# Patient Record
Sex: Female | Born: 1995 | Race: White | Hispanic: No | Marital: Single | Smoking: Former smoker
Health system: Southern US, Community
[De-identification: ages and names within clinical notes are randomized; demographics above are authoritative.]

## PROBLEM LIST (undated history)

## (undated) ENCOUNTER — Inpatient Hospital Stay (HOSPITAL_COMMUNITY): Payer: Self-pay

## (undated) DIAGNOSIS — R9389 Abnormal findings on diagnostic imaging of other specified body structures: Secondary | ICD-10-CM

## (undated) DIAGNOSIS — J302 Other seasonal allergic rhinitis: Secondary | ICD-10-CM

## (undated) DIAGNOSIS — Z789 Other specified health status: Secondary | ICD-10-CM

## (undated) HISTORY — DX: Other seasonal allergic rhinitis: J30.2

## (undated) HISTORY — DX: Abnormal findings on diagnostic imaging of other specified body structures: R93.89

---
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
## 2013-05-07 ENCOUNTER — Encounter (HOSPITAL_COMMUNITY): Payer: Self-pay | Admitting: Emergency Medicine

## 2013-05-07 ENCOUNTER — Emergency Department (HOSPITAL_COMMUNITY): Payer: Managed Care, Other (non HMO)

## 2013-05-07 ENCOUNTER — Emergency Department (HOSPITAL_COMMUNITY)
Admission: EM | Admit: 2013-05-07 | Discharge: 2013-05-07 | Disposition: A | Discharge status: Home / Self Care | Payer: Managed Care, Other (non HMO) | Attending: Emergency Medicine | Admitting: Emergency Medicine

## 2013-05-07 DIAGNOSIS — Y9301 Activity, walking, marching and hiking: Secondary | ICD-10-CM | POA: Insufficient documentation

## 2013-05-07 DIAGNOSIS — S61409A Unspecified open wound of unspecified hand, initial encounter: Secondary | ICD-10-CM | POA: Insufficient documentation

## 2013-05-07 DIAGNOSIS — S80812A Abrasion, left lower leg, initial encounter: Secondary | ICD-10-CM

## 2013-05-07 DIAGNOSIS — W19XXXA Unspecified fall, initial encounter: Secondary | ICD-10-CM

## 2013-05-07 DIAGNOSIS — F17200 Nicotine dependence, unspecified, uncomplicated: Secondary | ICD-10-CM | POA: Insufficient documentation

## 2013-05-07 DIAGNOSIS — S61411A Laceration without foreign body of right hand, initial encounter: Secondary | ICD-10-CM

## 2013-05-07 DIAGNOSIS — W010XXA Fall on same level from slipping, tripping and stumbling without subsequent striking against object, initial encounter: Secondary | ICD-10-CM | POA: Insufficient documentation

## 2013-05-07 DIAGNOSIS — IMO0002 Reserved for concepts with insufficient information to code with codable children: Secondary | ICD-10-CM | POA: Insufficient documentation

## 2013-05-07 DIAGNOSIS — Y929 Unspecified place or not applicable: Secondary | ICD-10-CM | POA: Insufficient documentation

## 2013-05-07 MED ORDER — HYDROCODONE-ACETAMINOPHEN 5-325 MG PO TABS: 1.0000 | ORAL_TABLET | ORAL | Status: DC | PRN

## 2013-05-07 NOTE — ED Notes (Signed)
PA at the bedside suturing patients hand.

## 2013-05-07 NOTE — ED Notes (Signed)
Approx 2 inch laceration to R palm.  Pt states she slipped on ice and fell just pta. Bleeding controlled pta.

## 2013-05-07 NOTE — ED Notes (Signed)
Md at the bedside suturing patient's laceration of the right hand.

## 2013-05-07 NOTE — ED Provider Notes (Signed)
Medical screening examination/treatment/procedure(s) were performed by non-physician practitioner and as supervising physician I was immediately available for consultation/collaboration.   EKG Interpretation None       Stacee Earp M Gifford Ballon, MD 05/07/13 0815 

## 2013-05-07 NOTE — ED Provider Notes (Signed)
CSN: [id number]     Arrival date & time 05/07/13  7829 History   First MD Initiated Contact with Patient 05/07/13 [phone]     Chief Complaint  Patient presents with  . Extremity Laceration     (Consider location/radiation/quality/duration/timing/severity/associated sxs/prior Treatment) HPI  18 year old female presents for evaluation of right hand laceration. Patient report last night she was walking towards her car, slipped on ice, fell forward hitting her hand against hard surface, in which she suffered a hand laceration. She denies hitting her head or loss of consciousness. She did hit her left shin against the ground as well was able to ambulate afterward without any difficulty. Currently she is complaining of sharp pain to the palm of the right hand. She denies any significant pain to the wrist or elbow. No specific treatment tried. She is up-to-date with her tetanus. She has no other complaints.  Past Medical History  Diagnosis Date  . Seasonal allergies    Past Surgical History  Procedure Laterality Date  . Induced abortion  2013   Family History  Problem Relation Age of Onset  . Diabetes Other     Great gradparents paternal  . Diabetes Other     Great gradparents maternal  . Hypertension Maternal Grandmother   . Hypertension Maternal Grandfather   . Allergies Brother   . Allergies Maternal Grandfather   . Heart disease Other     great maternal grandparent  . Lung cancer Other     great maternal grandparent  . Pancreatic cancer Other     great maternal grandparent  . Melanoma Other     great maternal grandparent  . Asthma Mother   . Emphysema Other     great grandfather  . Heart attack Maternal Grandfather    History  Substance Use Topics  . Smoking status: Current Some Day Smoker -- 1.00 packs/day for 3 years    Types: Cigarettes  . Smokeless tobacco: Not on file     Comment: Pt states she smoked less than 1 PPD  . Alcohol Use: No   OB History   Grav Para  Term Preterm Abortions TAB SAB Ect Mult Living   1              Review of Systems  Constitutional: Negative for fever.  Skin: Positive for wound.  Neurological: Negative for numbness.      Allergies  Cefprozil  Home Medications  No current outpatient prescriptions on file. BP 115/62  Pulse 60  Temp(Src) 98 F (36.7 C) (Oral)  Resp 18  SpO2 97%  LMP 04/02/2013 Physical Exam  Nursing note and vitals reviewed. Constitutional: She appears well-developed and well-nourished. No distress.  HENT:  Head: Atraumatic.  Eyes: Conjunctivae are normal.  Neck: Neck supple.  Neurological: She is alert.  Skin: No rash noted.  R hand: 5cm deep laceration to palm of hand overlying hypothenar eminence, no fb noted, bleeding controlled.  ttp   L tib/fib: small anterior skin abrasion with minimal tenderness, no deep laceration, not actively bleeding. No fb.   Psychiatric: She has a normal mood and affect.    ED Course  Procedures (including critical care time)  7:36 AM Pt has a mechanical fall, suffered laceration to her hypothenar eminence involving muscle.  Sutured repair performed by me.  Due to muscle involvement, recommend hand f/u as needed. Pt made aware to remove sutures in 10 days.  Work note provided.     LACERATION REPAIR Performed by: Fayrene Helper Authorized by:  Anola Mcgough Consent: Verbal consent obtained. Risks and benefits: risks, benefits and alternatives were discussed Consent given by: patient Patient identity confirmed: provided demographic data Prepped and Draped in normal sterile fashion Wound explored  Laceration Location: R hand: hypothenar eminence  Laceration Length: 5 cm  No Foreign Bodies seen or palpated  Anesthesia: local infiltration  Local anesthetic: lidocaine 2% w epinephrine  Anesthetic total: 6 ml  Irrigation method: syringe Amount of cleaning: standard  Skin closure: prolene 5.0  Number of sutures: 12  Technique: simple  interrupted, approximation, sharp debridement.  Patient tolerance: Patient tolerated the procedure well with no immediate complications.   Labs Review Labs Reviewed - No data to display Imaging Review Dg Hand Complete Right  05/07/2013   CLINICAL DATA:  Fall, laceration to palm of hand.  EXAM: RIGHT HAND - COMPLETE 3+ VIEW  COMPARISON:  None.  FINDINGS: There is no evidence of fracture or dislocation. There is no evidence of arthropathy or other focal bone abnormality. Soft tissue swelling with overlying bandage material seen at the volar aspect of the right hand, compatible with history of laceration. No retained foreign body.  IMPRESSION: No acute fracture or dislocation.  Soft tissue swelling with irregularity at the volar aspect of the right hand, compatible with history of laceration.   Electronically Signed   By: Rise MuBenjamin  McClintock M.D.   On: 05/07/2013 04:35     EKG Interpretation None      MDM   Final diagnoses:  Fall from standing  Laceration of right hand  Abrasion of anterior left lower leg    BP 119/83  Pulse 84  Temp(Src) 97.8 F (36.6 C) (Oral)  Resp 16  SpO2 96%  LMP 04/02/2013  I have reviewed nursing notes and vital signs. I personally reviewed the imaging tests through PACS system  I reviewed available ER/hospitalization records thought the EMR     Fayrene HelperBowie Chenika Nevils, [place] JerseyPA-C 05/07/13 [id number]

## 2013-05-07 NOTE — Discharge Instructions (Signed)
Apply neosporin to wound edge daily.  Keep your hand spread to allow skin to align correctly.  Follow up with your doctor in 10 days for sutures removal.  Follow up with hand specialist if you have any complications.  Return if you notice any signs of infection.    Laceration Care, Adult A laceration is a cut or lesion that goes through all layers of the skin and into the tissue just beneath the skin. TREATMENT  Some lacerations may not require closure. Some lacerations may not be able to be closed due to an increased risk of infection. It is important to see your caregiver as soon as possible after an injury to minimize the risk of infection and maximize the opportunity for successful closure. If closure is appropriate, pain medicines may be given, if needed. The wound will be cleaned to help prevent infection. Your caregiver will use stitches (sutures), staples, wound glue (adhesive), or skin adhesive strips to repair the laceration. These tools bring the skin edges together to allow for faster healing and a better cosmetic outcome. However, all wounds will heal with a scar. Once the wound has healed, scarring can be minimized by covering the wound with sunscreen during the day for 1 full year. HOME CARE INSTRUCTIONS  For sutures or staples:  Keep the wound clean and dry.  If you were given a bandage (dressing), you should change it at least once a day. Also, change the dressing if it becomes wet or dirty, or as directed by your caregiver.  Wash the wound with soap and water 2 times a day. Rinse the wound off with water to remove all soap. Pat the wound dry with a clean towel.  After cleaning, apply a thin layer of the antibiotic ointment as recommended by your caregiver. This will help prevent infection and keep the dressing from sticking.  You may shower as usual after the first 24 hours. Do not soak the wound in water until the sutures are removed.  Only take over-the-counter or prescription  medicines for pain, discomfort, or fever as directed by your caregiver.  Get your sutures or staples removed as directed by your caregiver. For skin adhesive strips:  Keep the wound clean and dry.  Do not get the skin adhesive strips wet. You may bathe carefully, using caution to keep the wound dry.  If the wound gets wet, pat it dry with a clean towel.  Skin adhesive strips will fall off on their own. You may trim the strips as the wound heals. Do not remove skin adhesive strips that are still stuck to the wound. They will fall off in time. For wound adhesive:  You may briefly wet your wound in the shower or bath. Do not soak or scrub the wound. Do not swim. Avoid periods of heavy perspiration until the skin adhesive has fallen off on its own. After showering or bathing, gently pat the wound dry with a clean towel.  Do not apply liquid medicine, cream medicine, or ointment medicine to your wound while the skin adhesive is in place. This may loosen the film before your wound is healed.  If a dressing is placed over the wound, be careful not to apply tape directly over the skin adhesive. This may cause the adhesive to be pulled off before the wound is healed.  Avoid prolonged exposure to sunlight or tanning lamps while the skin adhesive is in place. Exposure to ultraviolet light in the first year will darken the scar.  The skin adhesive will usually remain in place for 5 to 10 days, then naturally fall off the skin. Do not pick at the adhesive film. You may need a tetanus shot if:  You cannot remember when you had your last tetanus shot.  You have never had a tetanus shot. If you get a tetanus shot, your arm may swell, get red, and feel warm to the touch. This is common and not a problem. If you need a tetanus shot and you choose not to have one, there is a rare chance of getting tetanus. Sickness from tetanus can be serious. SEEK MEDICAL CARE IF:   You have redness, swelling, or  increasing pain in the wound.  You see a red line that goes away from the wound.  You have yellowish-white fluid (pus) coming from the wound.  You have a fever.  You notice a bad smell coming from the wound or dressing.  Your wound breaks open before or after sutures have been removed.  You notice something coming out of the wound such as wood or glass.  Your wound is on your hand or foot and you cannot move a finger or toe. SEEK IMMEDIATE MEDICAL CARE IF:   Your pain is not controlled with prescribed medicine.  You have severe swelling around the wound causing pain and numbness or a change in color in your arm, hand, leg, or foot.  Your wound splits open and starts bleeding.  You have worsening numbness, weakness, or loss of function of any joint around or beyond the wound.  You develop painful lumps near the wound or on the skin anywhere on your body. MAKE SURE YOU:   Understand these instructions.  Will watch your condition.  Will get help right away if you are not doing well or get worse. Document Released: 02/24/2005 Document Revised: 05/19/2011 Document Reviewed: 08/20/2010 [facility] Patient Information 2014 [place], [place].

## 2014-01-09 ENCOUNTER — Encounter (HOSPITAL_COMMUNITY): Payer: Self-pay | Admitting: Emergency Medicine

## 2014-06-09 ENCOUNTER — Emergency Department (INDEPENDENT_AMBULATORY_CARE_PROVIDER_SITE_OTHER)
Admission: EM | Admit: 2014-06-09 | Discharge: 2014-06-09 | Disposition: A | Discharge status: Home / Self Care | Payer: Managed Care, Other (non HMO) | Source: Home / Self Care | Attending: Family Medicine | Admitting: Family Medicine

## 2014-06-09 ENCOUNTER — Encounter (HOSPITAL_COMMUNITY): Payer: Self-pay

## 2014-06-09 DIAGNOSIS — M545 Low back pain, unspecified: Secondary | ICD-10-CM

## 2014-06-09 MED ORDER — CYCLOBENZAPRINE HCL 10 MG PO TABS: 10.0000 mg | ORAL_TABLET | Freq: Every evening | ORAL | Status: DC | PRN

## 2014-06-09 MED ORDER — DICLOFENAC SODIUM 75 MG PO TBEC: 75.0000 mg | DELAYED_RELEASE_TABLET | Freq: Two times a day (BID) | ORAL | Status: DC | PRN

## 2014-06-09 NOTE — ED Provider Notes (Signed)
Kristine Hurst is a 19 y.o. female who presents to Urgent Care today for low back pain. Patient notes mild low back pain present for about a week worsening recently. The pain worsened this morning. Pain is felt in the bilateral low back. The pain does not radiate. No weakness numbness bowel bladder dysfunction or difficulty walking. She has tried some over-the-counter medications which helps some. No Fevers or chills nausea vomiting or diarrhea.   Past Medical History  Diagnosis Date  . Seasonal allergies    Past Surgical History  Procedure Laterality Date  . Induced abortion  2013   History  Substance Use Topics  . Smoking status: Current Some Day Smoker -- 1.00 packs/day for 3 years    Types: Cigarettes  . Smokeless tobacco: Not on file     Comment: Pt states she smoked less than 1 PPD  . Alcohol Use: No   ROS as above Medications: No current facility-administered medications for this encounter.   Current Outpatient Prescriptions  Medication Sig Dispense Refill  . cyclobenzaprine (FLEXERIL) 10 MG tablet Take 1 tablet (10 mg total) by mouth at bedtime as needed for muscle spasms. 20 tablet 0  . diclofenac (VOLTAREN) 75 MG EC tablet Take 1 tablet (75 mg total) by mouth 2 (two) times daily as needed. 30 tablet 0   Allergies  Allergen Reactions  . Cefprozil     *CEFZIL*     Exam:  BP 94/62 mmHg  Pulse 62  Temp(Src) 98.5 F (36.9 C) (Oral)  Resp 18  SpO2 99% Gen: Well NAD HEENT: EOMI,  MMM Lungs: Normal work of breathing. CTABL Heart: RRR no MRG Abd: NABS, Soft. Nondistended, Nontender Exts: Brisk capillary refill, warm and well perfused.  Back: Nontender to spinal midline. Tender palpation bilateral lumbar paraspinals. Low back range of motion is intact with pain with flexion. Negative straight leg raise test and Faber test bilaterally. Reflexes are intact and equal bilateral lower extremities knees and ankles. Strength is intact and equal bilateral lower  extremity. Normal gait. Sensation intact throughout.  No results found for this or any previous visit (from the past 24 hour(s)). No results found.  Assessment and Plan: 19 y.o. female with lumbago due to myofascial strain. Treat with home exercise program, heating pad, Flexeril, and diclofenac.  Discussed warning signs or symptoms. Please see discharge instructions. Patient expresses understanding.     Rodolph BongEvan S Felice Hope, MD 06/09/14 2119

## 2014-06-09 NOTE — ED Notes (Signed)
States she has been having pain in her back x 1 week, much worse today

## 2014-06-09 NOTE — Discharge Instructions (Signed)
Thank you for coming in today. °Come back or go to the emergency room if you notice new weakness new numbness problems walking or bowel or bladder problems. ° ° °Back Exercises °These exercises may help you when beginning to rehabilitate your injury. Your symptoms may resolve with or without further involvement from your physician, physical therapist or athletic trainer. While completing these exercises, remember:  °· Restoring tissue flexibility helps normal motion to return to the joints. This allows healthier, less painful movement and activity. °· An effective stretch should be held for at least 30 seconds. °· A stretch should never be painful. You should only feel a gentle lengthening or release in the stretched tissue. °STRETCH - Extension, Prone on Elbows  °· Lie on your stomach on the floor, a bed will be too soft. Place your palms about shoulder width apart and at the height of your head. °· Place your elbows under your shoulders. If this is too painful, stack pillows under your chest. °· Allow your body to relax so that your hips drop lower and make contact more completely with the floor. °· Hold this position for __________ seconds. °· Slowly return to lying flat on the floor. °Repeat __________ times. Complete this exercise __________ times per day.  °RANGE OF MOTION - Extension, Prone Press Ups  °· Lie on your stomach on the floor, a bed will be too soft. Place your palms about shoulder width apart and at the height of your head. °· Keeping your back as relaxed as possible, slowly straighten your elbows while keeping your hips on the floor. You may adjust the placement of your hands to maximize your comfort. As you gain motion, your hands will come more underneath your shoulders. °· Hold this position __________ seconds. °· Slowly return to lying flat on the floor. °Repeat __________ times. Complete this exercise __________ times per day.  °RANGE OF MOTION- Quadruped, Neutral Spine  °· Assume a hands  and knees position on a firm surface. Keep your hands under your shoulders and your knees under your hips. You may place padding under your knees for comfort. °· Drop your head and point your tail bone toward the ground below you. This will round out your low back like an angry cat. Hold this position for __________ seconds. °· Slowly lift your head and release your tail bone so that your back sags into a large arch, like an old horse. °· Hold this position for __________ seconds. °· Repeat this until you feel limber in your low back. °· Now, find your "sweet spot." This will be the most comfortable position somewhere between the two previous positions. This is your neutral spine. Once you have found this position, tense your stomach muscles to support your low back. °· Hold this position for __________ seconds. °Repeat __________ times. Complete this exercise __________ times per day.  °STRETCH - Flexion, Single Knee to Chest  °· Lie on a firm bed or floor with both legs extended in front of you. °· Keeping one leg in contact with the floor, bring your opposite knee to your chest. Hold your leg in place by either grabbing behind your thigh or at your knee. °· Pull until you feel a gentle stretch in your low back. Hold __________ seconds. °· Slowly release your grasp and repeat the exercise with the opposite side. °Repeat __________ times. Complete this exercise __________ times per day.  °STRETCH - Hamstrings, Standing °· Stand or sit and extend your right / left   leg, placing your foot on a chair or foot stool °· Keeping a slight arch in your low back and your hips straight forward. °· Lead with your chest and lean forward at the waist until you feel a gentle stretch in the back of your right / left knee or thigh. (When done correctly, this exercise requires leaning only a small distance.) °· Hold this position for __________ seconds. °Repeat __________ times. Complete this stretch __________ times per  day. °STRENGTHENING - Deep Abdominals, Pelvic Tilt  °· Lie on a firm bed or floor. Keeping your legs in front of you, bend your knees so they are both pointed toward the ceiling and your feet are flat on the floor. °· Tense your lower abdominal muscles to press your low back into the floor. This motion will rotate your pelvis so that your tail bone is scooping upwards rather than pointing at your feet or into the floor. °· With a gentle tension and even breathing, hold this position for __________ seconds. °Repeat __________ times. Complete this exercise __________ times per day.  °STRENGTHENING - Abdominals, Crunches  °· Lie on a firm bed or floor. Keeping your legs in front of you, bend your knees so they are both pointed toward the ceiling and your feet are flat on the floor. Cross your arms over your chest. °· Slightly tip your chin down without bending your neck. °· Tense your abdominals and slowly lift your trunk high enough to just clear your shoulder blades. Lifting higher can put excessive stress on the low back and does not further strengthen your abdominal muscles. °· Control your return to the starting position. °Repeat __________ times. Complete this exercise __________ times per day.  °STRENGTHENING - Quadruped, Opposite UE/LE Lift  °· Assume a hands and knees position on a firm surface. Keep your hands under your shoulders and your knees under your hips. You may place padding under your knees for comfort. °· Find your neutral spine and gently tense your abdominal muscles so that you can maintain this position. Your shoulders and hips should form a rectangle that is parallel with the floor and is not twisted. °· Keeping your trunk steady, lift your right hand no higher than your shoulder and then your left leg no higher than your hip. Make sure you are not holding your breath. Hold this position __________ seconds. °· Continuing to keep your abdominal muscles tense and your back steady, slowly return  to your starting position. Repeat with the opposite arm and leg. °Repeat __________ times. Complete this exercise __________ times per day. °Document Released: 03/14/2005 Document Revised: 05/19/2011 Document Reviewed: 06/08/2008 °[employer]® Patient Information ©2015 [employer]. This information is not intended to replace advice given to you by your health care provider. Make sure you discuss any questions you have with your health care provider. ° °

## 2014-08-10 ENCOUNTER — Emergency Department (HOSPITAL_COMMUNITY)
Admission: EM | Admit: 2014-08-10 | Discharge: 2014-08-10 | Disposition: A | Discharge status: Home / Self Care | Payer: Managed Care, Other (non HMO) | Source: Home / Self Care | Attending: Family Medicine | Admitting: Family Medicine

## 2014-08-10 ENCOUNTER — Encounter (HOSPITAL_COMMUNITY): Payer: Self-pay | Admitting: Family Medicine

## 2014-08-10 DIAGNOSIS — R1084 Generalized abdominal pain: Secondary | ICD-10-CM | POA: Diagnosis not present

## 2014-08-10 DIAGNOSIS — R110 Nausea: Secondary | ICD-10-CM | POA: Diagnosis not present

## 2014-08-10 MED ORDER — OMEPRAZOLE 40 MG PO CPDR: 40.0000 mg | DELAYED_RELEASE_CAPSULE | Freq: Every day | ORAL | Status: DC

## 2014-08-10 MED ORDER — ONDANSETRON HCL 4 MG PO TABS: 4.0000 mg | ORAL_TABLET | Freq: Three times a day (TID) | ORAL | Status: DC | PRN

## 2014-08-10 NOTE — ED Provider Notes (Signed)
CSN: [id number]     Arrival date & time 08/10/14  1744 History   First MD Initiated Contact with Patient 08/10/14 1810     Chief Complaint  Patient presents with  . Nausea   (Consider location/radiation/quality/duration/timing/severity/associated sxs/prior Treatment) HPI  Nausea and abd cramping. Started 4 wks ago. Decreased appetite. Getting worse. Ibuprofen and tyelnol w/ come benefit. Comes and goes. Typically comes on when eating after work in the evenings. Able to eat at work w/o symptoms. Occassional emesis which burns esophagus. LMP on 07/25/14. Associated w/ intermittent lightheadedness when "I stand up to quickly."   Denies diarrhea, constipation, fevers, rash, headache, chest pain, shortness of breath, emesis, hematochezia.   Past Medical History  Diagnosis Date  . Seasonal allergies    Past Surgical History  Procedure Laterality Date  . Induced abortion  2013   Family History  Problem Relation Age of Onset  . Diabetes Other     Great gradparents paternal  . Diabetes Other     Great gradparents maternal  . Hypertension Maternal Grandmother   . Hypertension Maternal Grandfather   . Allergies Brother   . Allergies Maternal Grandfather   . Heart disease Other     great maternal grandparent  . Lung cancer Other     great maternal grandparent  . Pancreatic cancer Other     great maternal grandparent  . Melanoma Other     great maternal grandparent  . Asthma Mother   . Emphysema Other     great grandfather  . Heart attack Maternal Grandfather    History  Substance Use Topics  . Smoking status: Current Some Day Smoker -- 1.00 packs/day for 3 years    Types: Cigarettes  . Smokeless tobacco: Not on file     Comment: Pt states she smoked less than 1 PPD  . Alcohol Use: No   OB History    Gravida Para Term Preterm AB TAB SAB Ectopic Multiple Living   1              Review of Systems Per HPI with all other pertinent systems negative.   Allergies   Cefprozil  Home Medications   Prior to Admission medications   Medication Sig Start Date End Date Taking? Authorizing Provider  omeprazole (PRILOSEC) 40 MG capsule Take 1 capsule (40 mg total) by mouth daily. Take for 14 days then as needed 08/10/14   Ozella Rocks, MD  ondansetron (ZOFRAN) 4 MG tablet Take 1 tablet (4 mg total) by mouth every 8 (eight) hours as needed for nausea or vomiting. 08/10/14   Ozella Rocks, MD   Pulse 97  Temp(Src) 98.6 F (37 C) (Oral)  Resp 16  SpO2 97%  LMP 07/25/2014 Physical Exam Physical Exam  Constitutional: oriented to person, place, and time. appears well-developed and well-nourished. No distress.  HENT:  Head: Normocephalic and atraumatic.  Eyes: EOMI. PERRL.  Neck: Normal range of motion.  Cardiovascular: RRR, no m/r/g, 2+ distal pulses,  Pulmonary/Chest: Effort normal and breath sounds normal. No respiratory distress.  Abdominal: Soft. Bowel sounds are normal. NonTTP, no distension.  Musculoskeletal: Normal range of motion. Non ttp, no effusion.  Neurological: alert and oriented to person, place, and time.  Skin: Skin is warm. No rash noted. non diaphoretic.  Psychiatric: normal mood and affect. behavior is normal. Judgment and thought content normal.   ED Course  Procedures (including critical care time) Labs Review Labs Reviewed - No data to display  Imaging Review No  results found.   MDM   1. Generalized abdominal pain    H. pylori negative. Will treat with Zofran and PPI. Patient describing some reflux type symptoms. Follow up with PCP if needed. Unlikely related to cholecystitis or appendicitis.    Ozella Rocksavid J Terriana Barreras, MD 08/10/14 [phone]

## 2014-08-10 NOTE — ED Notes (Signed)
C/o  Nausea with a decreased appetite for about a month.  Mild abdominal discomforts across center of abdomen.  Denies vomiting and diarrhea.   Mild relief in pain with otc meds.   Last known LMP 07/25/2014

## 2014-08-10 NOTE — Discharge Instructions (Signed)
The cause of your symptoms is likely from a combination of a mild viral infection and reflux. The H. pylori test was negative. Please take the zofran for nausea and use the prilosec to decrease the acid production in your stomach.

## 2014-08-11 LAB — POCT H PYLORI SCREEN: H. PYLORI SCREEN, POC: NEGATIVE

## 2014-11-19 ENCOUNTER — Emergency Department (HOSPITAL_COMMUNITY)
Admission: EM | Admit: 2014-11-19 | Discharge: 2014-11-20 | Disposition: A | Discharge status: Home / Self Care | Payer: Managed Care, Other (non HMO) | Attending: Emergency Medicine | Admitting: Emergency Medicine

## 2014-11-19 ENCOUNTER — Encounter (HOSPITAL_COMMUNITY): Payer: Self-pay | Admitting: Emergency Medicine

## 2014-11-19 DIAGNOSIS — O210 Mild hyperemesis gravidarum: Secondary | ICD-10-CM | POA: Insufficient documentation

## 2014-11-19 DIAGNOSIS — Z3A01 Less than 8 weeks gestation of pregnancy: Secondary | ICD-10-CM | POA: Diagnosis not present

## 2014-11-19 DIAGNOSIS — R1013 Epigastric pain: Secondary | ICD-10-CM | POA: Insufficient documentation

## 2014-11-19 DIAGNOSIS — O99331 Smoking (tobacco) complicating pregnancy, first trimester: Secondary | ICD-10-CM | POA: Diagnosis not present

## 2014-11-19 DIAGNOSIS — O99281 Endocrine, nutritional and metabolic diseases complicating pregnancy, first trimester: Secondary | ICD-10-CM | POA: Insufficient documentation

## 2014-11-19 DIAGNOSIS — R112 Nausea with vomiting, unspecified: Secondary | ICD-10-CM

## 2014-11-19 DIAGNOSIS — O9989 Other specified diseases and conditions complicating pregnancy, childbirth and the puerperium: Secondary | ICD-10-CM | POA: Insufficient documentation

## 2014-11-19 DIAGNOSIS — R05 Cough: Secondary | ICD-10-CM | POA: Diagnosis not present

## 2014-11-19 DIAGNOSIS — F17210 Nicotine dependence, cigarettes, uncomplicated: Secondary | ICD-10-CM | POA: Diagnosis not present

## 2014-11-19 DIAGNOSIS — Z3490 Encounter for supervision of normal pregnancy, unspecified, unspecified trimester: Secondary | ICD-10-CM

## 2014-11-19 MED ORDER — ONDANSETRON 4 MG PO TBDP
4.0000 mg | ORAL_TABLET | Freq: Once | ORAL | Status: AC
  Administered 2014-11-20: 4 mg via ORAL
  Filled 2014-11-19: qty 1

## 2014-11-19 NOTE — ED Provider Notes (Signed)
CSN: [id number]     Arrival date & time 11/19/14  2311 History   First MD Initiated Contact with Patient 11/19/14 2335     Chief Complaint  Patient presents with  . Emesis     (Consider location/radiation/quality/duration/timing/severity/associated sxs/prior Treatment) HPI Comments: This is a morbidly obese [age]-year-old with a six-day history of cough, nausea and vomiting.  She was seen by her PCP and she was given a cough medication and a prescription for Zofran, which she has not filled and she has persistent nausea and vomiting.  She states over the past 2 days.  She's been unable to tolerate any even liquid.  Patient is a 19 y.o. female presenting with vomiting. The history is provided by the patient.  Emesis Severity:  Mild Duration:  6 days Timing:  Intermittent Quality:  Bilious material Progression:  Unchanged Chronicity:  Recurrent Relieved by:  None tried Ineffective treatments:  None tried Associated symptoms: abdominal pain and cough   Associated symptoms: no chills and no diarrhea   Abdominal pain:    Location:  Epigastric   Quality:  Dull   Severity:  Mild   Onset quality:  Unable to specify   Timing:  Unable to specify   Progression:  Unable to specify   Chronicity:  Recurrent   Past Medical History  Diagnosis Date  . Seasonal allergies    Past Surgical History  Procedure Laterality Date  . Induced abortion  2013   Family History  Problem Relation Age of Onset  . Diabetes Other     Great gradparents paternal  . Diabetes Other     Great gradparents maternal  . Hypertension Maternal Grandmother   . Hypertension Maternal Grandfather   . Allergies Brother   . Allergies Maternal Grandfather   . Heart disease Other     great maternal grandparent  . Lung cancer Other     great maternal grandparent  . Pancreatic cancer Other     great maternal grandparent  . Melanoma Other     great maternal grandparent  . Asthma Mother   . Emphysema Other    great grandfather  . Heart attack Maternal Grandfather    Social History  Substance Use Topics  . Smoking status: Current Some Day Smoker -- 0.00 packs/day for 3 years    Types: Cigarettes  . Smokeless tobacco: None     Comment: Pt states she smoked less than 1 PPD  . Alcohol Use: No   OB History    Gravida Para Term Preterm AB TAB SAB Ectopic Multiple Living   1              Review of Systems  Constitutional: Negative for fever and chills.  Respiratory: Positive for cough. Negative for shortness of breath.   Gastrointestinal: Positive for nausea, vomiting and abdominal pain. Negative for diarrhea, constipation and abdominal distention.  Genitourinary: Negative for dysuria.  Skin: Negative for rash and wound.  All other systems reviewed and are negative.     Allergies  Cefprozil  Home Medications   Prior to Admission medications   Medication Sig Start Date End Date Taking? Authorizing Provider  omeprazole (PRILOSEC) 40 MG capsule Take 1 capsule (40 mg total) by mouth daily. Take for 14 days then as needed 08/10/14   Ozella Rocks, MD  ondansetron (ZOFRAN) 4 MG tablet Take 1 tablet (4 mg total) by mouth every 8 (eight) hours as needed for nausea or vomiting. 08/10/14   Ozella Rocks, MD  BP 105/77 mmHg  Pulse 72  Temp(Src) 97.7 F (36.5 C) (Oral)  Resp 18  Ht 5\' 7"  (1.702 m)  Wt 270 lb (122.471 kg)  BMI 42.28 kg/m2  SpO2 100%  LMP 10/15/2014 Physical Exam  Constitutional: She is oriented to person, place, and time. She appears well-developed and well-nourished.  HENT:  Head: Normocephalic.  Eyes: Pupils are equal, round, and reactive to light.  Neck: Normal range of motion.  Cardiovascular: Normal rate and regular rhythm.   Pulmonary/Chest: Effort normal and breath sounds normal.  Abdominal: Soft. Bowel sounds are normal. She exhibits no distension. There is tenderness in the epigastric area.  Musculoskeletal: Normal range of motion.  Neurological: She is  alert and oriented to person, place, and time.  Skin: Skin is warm and dry. No rash noted.  Psychiatric: She has a normal mood and affect.  Nursing note and vitals reviewed.   ED Course  Procedures (including critical care time) Labs Review Labs Reviewed  I-STAT CHEM 8, ED - Abnormal; Notable for the following:    Potassium 3.4 (*)    Chloride 98 (*)    All other components within normal limits  I-STAT BETA HCG BLOOD, ED ([facility], [facility], [facility] ONLY) - Abnormal; Notable for the following:    I-stat hCG, quantitative >2000.0 (*)    All other components within normal limits  CBC WITH DIFFERENTIAL/PLATELET    Imaging Review No results found. I have personally reviewed and evaluated these images and lab results as part of my medical decision-making.   EKG Interpretation None     Patient's pregnancy test is positive.  She has been given instructions to use over-the-counter Unisom to help control nausea and vomiting MDM   Final diagnoses:  Non-intractable vomiting with nausea, vomiting of unspecified type  Pregnancy         Earley Favor, NP 11/20/14 0129  Earley Favor, NP 11/20/14 1610  Alvira Monday, MD 11/23/14 1221

## 2014-11-19 NOTE — ED Notes (Signed)
Gail, NP at the bedside.  

## 2014-11-19 NOTE — ED Notes (Addendum)
Pt. reports persistent nausea and vomitting onset last week diagnosed by her PCP with " stomach virus" , denies diarrhea / no fever or chills.

## 2014-11-20 LAB — CBC WITH DIFFERENTIAL/PLATELET
BASOS ABS: 0 10*3/uL (ref 0.0–0.1)
Basophils Relative: 0 % (ref 0–1)
EOS ABS: 0 10*3/uL (ref 0.0–0.7)
Eosinophils Relative: 0 % (ref 0–5)
HCT: 40.5 % (ref 36.0–46.0)
HEMOGLOBIN: 14 g/dL (ref 12.0–15.0)
LYMPHS ABS: 2.4 10*3/uL (ref 0.7–4.0)
Lymphocytes Relative: 23 % (ref 12–46)
MCH: 31.4 pg (ref 26.0–34.0)
MCHC: 34.6 g/dL (ref 30.0–36.0)
MCV: 90.8 fL (ref 78.0–100.0)
Monocytes Absolute: 0.9 10*3/uL (ref 0.1–1.0)
Monocytes Relative: 8 % (ref 3–12)
NEUTROS PCT: 69 % (ref 43–77)
Neutro Abs: 7 10*3/uL (ref 1.7–7.7)
Platelets: 292 10*3/uL (ref 150–400)
RBC: 4.46 MIL/uL (ref 3.87–5.11)
RDW: 12.4 % (ref 11.5–15.5)
WBC: 10.4 10*3/uL (ref 4.0–10.5)

## 2014-11-20 LAB — I-STAT CHEM 8, ED
BUN: 7 mg/dL (ref 6–20)
CHLORIDE: 98 mmol/L — AB (ref 101–111)
Calcium, Ion: 1.2 mmol/L (ref 1.12–1.23)
Creatinine, Ser: 0.8 mg/dL (ref 0.44–1.00)
Glucose, Bld: 84 mg/dL (ref 65–99)
HCT: 42 % (ref 36.0–46.0)
Hemoglobin: 14.3 g/dL (ref 12.0–15.0)
POTASSIUM: 3.4 mmol/L — AB (ref 3.5–5.1)
Sodium: 135 mmol/L (ref 135–145)
TCO2: 23 mmol/L (ref 0–100)

## 2014-11-20 LAB — I-STAT BETA HCG BLOOD, ED (MC, WL, AP ONLY): I-stat hCG, quantitative: >2000 m[IU]/mL — ABNORMAL HIGH (ref <5)

## 2014-11-20 NOTE — Discharge Instructions (Signed)
Nausea and Vomiting Nausea means you feel sick to your stomach. Throwing up (vomiting) is a reflex where stomach contents come out of your mouth. HOME CARE   Take medicine as told by your doctor.  Do not force yourself to eat. However, you do need to drink fluids.  If you feel like eating, eat a normal diet as told by your doctor.  Eat rice, wheat, potatoes, bread, lean meats, yogurt, fruits, and vegetables.  Avoid high-fat foods.  Drink enough fluids to keep your pee (urine) clear or pale yellow.  Ask your doctor how to replace body fluid losses (rehydrate). Signs of body fluid loss (dehydration) include:  Feeling very thirsty.  Dry lips and mouth.  Feeling dizzy.  Dark pee.  Peeing less than normal.  Feeling confused.  Fast breathing or heart rate. GET HELP RIGHT AWAY IF:   You have blood in your throw up.  You have black or bloody poop (stool).  You have a bad headache or stiff neck.  You feel confused.  You have bad belly (abdominal) pain.  You have chest pain or trouble breathing.  You do not pee at least once every 8 hours.  You have cold, clammy skin.  You keep throwing up after 24 to 48 hours.  You have a fever. MAKE SURE YOU:   Understand these instructions.  Will watch your condition.  Will get help right away if you are not doing well or get worse. Document Released: 08/13/2007 Document Revised: 05/19/2011 Document Reviewed: 07/26/2010 [facility] Patient Information 2015 [place], [place]. This information is not intended to replace advice given to you by your health care provider. Make sure you discuss any questions you have with your health care provider. If you cannot afford to buy the Zofran, you can buy over-the-counter Unisom from [facility] take 1/2-1 tablet every 4-6 hours as needed for nausea and vomiting

## 2014-11-20 NOTE — ED Notes (Signed)
Given sprite to sip on for PO fluid challenge.

## 2014-11-20 NOTE — ED Notes (Signed)
Lab at the bedside 

## 2015-02-06 ENCOUNTER — Emergency Department (HOSPITAL_COMMUNITY)
Admission: EM | Admit: 2015-02-06 | Discharge: 2015-02-06 | Disposition: A | Discharge status: Home / Self Care | Payer: Managed Care, Other (non HMO) | Attending: Emergency Medicine | Admitting: Emergency Medicine

## 2015-02-06 ENCOUNTER — Emergency Department (HOSPITAL_COMMUNITY): Payer: Managed Care, Other (non HMO)

## 2015-02-06 ENCOUNTER — Encounter (HOSPITAL_COMMUNITY): Payer: Self-pay | Admitting: Emergency Medicine

## 2015-02-06 DIAGNOSIS — R000 Tachycardia, unspecified: Secondary | ICD-10-CM | POA: Diagnosis not present

## 2015-02-06 DIAGNOSIS — F17210 Nicotine dependence, cigarettes, uncomplicated: Secondary | ICD-10-CM | POA: Diagnosis not present

## 2015-02-06 DIAGNOSIS — M79674 Pain in right toe(s): Secondary | ICD-10-CM | POA: Diagnosis present

## 2015-02-06 DIAGNOSIS — L03031 Cellulitis of right toe: Secondary | ICD-10-CM | POA: Diagnosis not present

## 2015-02-06 DIAGNOSIS — L03011 Cellulitis of right finger: Secondary | ICD-10-CM

## 2015-02-06 MED ORDER — LIDOCAINE HCL (PF) 1 % IJ SOLN
5.0000 mL | Freq: Once | INTRAMUSCULAR | Status: DC
  Filled 2015-02-06: qty 5

## 2015-02-06 MED ORDER — SULFAMETHOXAZOLE-TRIMETHOPRIM 800-160 MG PO TABS: 1.0000 | ORAL_TABLET | Freq: Two times a day (BID) | ORAL | Status: DC

## 2015-02-06 MED ORDER — BUPIVACAINE HCL (PF) 0.5 % IJ SOLN
10.0000 mL | Freq: Once | INTRAMUSCULAR | Status: AC
  Administered 2015-02-06: 10 mL
  Filled 2015-02-06: qty 10

## 2015-02-06 MED ORDER — BUPIVACAINE HCL (PF) 0.25 % IJ SOLN: 10.0000 mL | Freq: Once | INTRAMUSCULAR | Status: DC

## 2015-02-06 MED ORDER — LIDOCAINE HCL (PF) 1 % IJ SOLN
5.0000 mL | Freq: Once | INTRAMUSCULAR | Status: AC
  Administered 2015-02-06: 5 mL

## 2015-02-06 MED ORDER — SULFAMETHOXAZOLE-TRIMETHOPRIM 800-160 MG PO TABS
1.0000 | ORAL_TABLET | Freq: Once | ORAL | Status: AC
  Administered 2015-02-06: 1 via ORAL
  Filled 2015-02-06: qty 1

## 2015-02-06 NOTE — ED Provider Notes (Signed)
INCISION AND DRAINAGE Performed by: Arman FilterSCHULZ,[place] Souter K Consent: Verbal consent obtained. Risks and benefits: risks, benefits and alternatives were discussed Type: abscess  Body area: toe  Anesthesia: local infiltration  Incision was made with a scalpel.  Local anesthetic: lidocaine .5% without epinephrine  Anesthetic total: 2 ml  Complexity: complex Blunt dissection to break up loculations  Drainage: purulent  Drainage amount: scant  Packing material: 1/4 in iodoform gauze  Patient tolerance: Patient tolerated the procedure well with no immediate complications.    Earley FavorGail Makalynn Berwanger, NP 02/06/15 [phone]  Dione Boozeavid Glick, MD 02/06/15 [phone]

## 2015-02-06 NOTE — ED Provider Notes (Signed)
CSN: [id number]     Arrival date & time 02/06/15  0054 History   First MD Initiated Contact with Patient 02/06/15 0132     Chief Complaint  Patient presents with  . Toe Pain     (Consider location/radiation/quality/duration/timing/severity/associated sxs/prior Treatment) Patient is a 19 y.o. female presenting with toe pain. The history is provided by the patient.  Toe Pain This is a new problem. The current episode started in the past 7 days. The problem occurs constantly. The problem has been gradually worsening.   Kristine Hurst is a 19 y.o. female who presents to the ED with pain and swelling of the right middle toe that started last week. She denies injury.  Past Medical History  Diagnosis Date  . Seasonal allergies    Past Surgical History  Procedure Laterality Date  . Induced abortion  2013   Family History  Problem Relation Age of Onset  . Diabetes Other     Great gradparents paternal  . Diabetes Other     Great gradparents maternal  . Hypertension Maternal Grandmother   . Hypertension Maternal Grandfather   . Allergies Brother   . Allergies Maternal Grandfather   . Heart disease Other     great maternal grandparent  . Lung cancer Other     great maternal grandparent  . Pancreatic cancer Other     great maternal grandparent  . Melanoma Other     great maternal grandparent  . Asthma Mother   . Emphysema Other     great grandfather  . Heart attack Maternal Grandfather    Social History  Substance Use Topics  . Smoking status: Current Some Day Smoker -- 0.00 packs/day for 3 years    Types: Cigarettes  . Smokeless tobacco: None     Comment: Pt states she smoked less than 1 PPD  . Alcohol Use: No   OB History    Gravida Para Term Preterm AB TAB SAB Ectopic Multiple Living   1              Review of Systems  Musculoskeletal:       Right toe pain  all other systems negative    Allergies  Cefprozil  Home Medications   Prior to Admission  medications   Medication Sig Start Date End Date Taking? Authorizing Provider  omeprazole (PRILOSEC) 40 MG capsule Take 1 capsule (40 mg total) by mouth daily. Take for 14 days then as needed 08/10/14   Ozella Rocks, MD  ondansetron (ZOFRAN) 4 MG tablet Take 1 tablet (4 mg total) by mouth every 8 (eight) hours as needed for nausea or vomiting. 08/10/14   Ozella Rocks, MD   BP 111/73 mmHg  Pulse 101  Temp(Src) 98.1 F (36.7 C) (Oral)  Resp 16  Ht  (1.702 m)  Wt 112.946 kg  BMI 38.99 kg/m2  SpO2 98%  LMP 01/24/2015 Physical Exam  Constitutional: She is oriented to person, place, and time. She appears well-developed and well-nourished. No distress.  HENT:  Head: Normocephalic.  Eyes: EOM are normal.  Neck: Neck supple.  Cardiovascular: Tachycardia present.   Pulmonary/Chest: Effort normal.  Musculoskeletal: Normal range of motion.       Feet:  Swelling, erythema and tenderness to the distal aspect of the right third toe. Pedal pulse 2+, adequate circulation.    Neurological: She is alert and oriented to person, place, and time. No cranial nerve deficit.  Skin: Skin is warm and dry.  Psychiatric: She  has a normal mood and affect. Her behavior is normal.  Nursing note and vitals reviewed.   ED Course  Procedures (including critical care time) Labs Review Labs Reviewed - No data to display  Imaging Review Dg Toe 3rd Right  02/06/2015  CLINICAL DATA:  Pain at the right third toe, with erythema, acute onset. Initial encounter. EXAM: RIGHT THIRD TOE COMPARISON:  Right foot radiographs performed 05/18/2010 FINDINGS: No definite osseous erosions are seen to suggest osteomyelitis. Diffuse soft tissue swelling is noted about the third toe. Visualized joint spaces are preserved. No radiopaque foreign bodies are seen. IMPRESSION: Diffuse soft tissue swelling about the third toe. No definite osseous erosion seen to suggest osteomyelitis, though evaluation for osteomyelitis is somewhat  limited on radiograph. Electronically Signed   By: Roanna RaiderJeffery  Chang M.D.   On: 02/06/2015 02:25    MDM  19 y.o. female with right third toe infection and tenderness.  [facility] Shultz, [place] @ 03:25 am.     [facility] Hospital[facility] M DurandNeese, [place] 02/06/15 [id number]  Dione Boozeavid Glick, MD 02/06/15 [phone]

## 2015-02-06 NOTE — Discharge Instructions (Signed)
Cellulitis Cellulitis is an infection of the skin and the tissue beneath it. The infected area is usually red and tender. Cellulitis occurs most often in the arms and lower legs.  CAUSES  Cellulitis is caused by bacteria that enter the skin through cracks or cuts in the skin. The most common types of bacteria that cause cellulitis are staphylococci and streptococci. SIGNS AND SYMPTOMS   Redness and warmth.  Swelling.  Tenderness or pain.  Fever. DIAGNOSIS  Your health care provider can usually determine what is wrong based on a physical exam. Blood tests may also be done. TREATMENT  Treatment usually involves taking an antibiotic medicine. HOME CARE INSTRUCTIONS   Take your antibiotic medicine as directed by your health care provider. Finish the antibiotic even if you start to feel better.  Keep the infected arm or leg elevated to reduce swelling.  Apply a warm cloth to the affected area up to 4 times per day to relieve pain.  Take medicines only as directed by your health care provider.  Keep all follow-up visits as directed by your health care provider. SEEK MEDICAL CARE IF:   You notice red streaks coming from the infected area.  Your red area gets larger or turns dark in color.  Your bone or joint underneath the infected area becomes painful after the skin has healed.  Your infection returns in the same area or another area.  You notice a swollen bump in the infected area.  You develop new symptoms.  You have a fever. SEEK IMMEDIATE MEDICAL CARE IF:   You feel very sleepy.  You develop vomiting or diarrhea.  You have a general ill feeling (malaise) with muscle aches and pains.   This information is not intended to replace advice given to you by your health care provider. Make sure you discuss any questions you have with your health care provider.   Document Released: 12/04/2004 Document Revised: 11/15/2014 Document Reviewed: 05/12/2011 Elsevier Interactive  Patient Education 2016 Elsevier Inc.  Paronychia  Paronychia is an infection of the skin. It happens near a fingernail or toenail. It may cause pain and swelling around the nail. Usually, it is not serious and it clears up with treatment. HOME CARE  Soak the fingers or toes in warm water as told by your doctor. You may be told to do this for 20 minutes, 2-3 times a day.  Keep the area dry when you are not soaking it.  Take medicines only as told by your doctor.  If you were given an antibiotic medicine, finish all of it even if you start to feel better.  Keep the affected area clean.  Do not try to drain a fluid-filled bump yourself.  Wear rubber gloves when putting your hands in water.  Wear gloves if your hands might touch cleaners or chemicals.  Follow your doctor's instructions about:  Wound care.  Bandage (dressing) changes and removal. GET HELP IF:  Your symptoms get worse or do not improve.  You have a fever or chills.  You have redness spreading from the affected area.  You have more fluid, blood, or pus coming from the affected area.  Your finger or knuckle is swollen or is hard to move.   This information is not intended to replace advice given to you by your health care provider. Make sure you discuss any questions you have with your health care provider.   Document Released: 02/12/2009 Document Revised: 07/11/2014 Document Reviewed: 02/01/2014 Elsevier Interactive Patient Education 2016  Elsevier Inc. Your infection was opend and drained pull the packing out in 2 days   Warm soaks 2-3 times a day for the next 2 days take all the antibiotic

## 2015-02-06 NOTE — ED Notes (Signed)
Pt. reports pain/swelling at right distal middle toe onset last week , denies injury / no drainage .

## 2015-02-08 ENCOUNTER — Emergency Department (HOSPITAL_COMMUNITY): Payer: Managed Care, Other (non HMO)

## 2015-02-08 ENCOUNTER — Encounter (HOSPITAL_COMMUNITY): Payer: Self-pay | Admitting: *Deleted

## 2015-02-08 ENCOUNTER — Emergency Department (HOSPITAL_COMMUNITY)
Admission: EM | Admit: 2015-02-08 | Discharge: 2015-02-08 | Disposition: A | Discharge status: Home / Self Care | Payer: Managed Care, Other (non HMO) | Attending: Emergency Medicine | Admitting: Emergency Medicine

## 2015-02-08 DIAGNOSIS — Z79899 Other long term (current) drug therapy: Secondary | ICD-10-CM | POA: Diagnosis not present

## 2015-02-08 DIAGNOSIS — F17210 Nicotine dependence, cigarettes, uncomplicated: Secondary | ICD-10-CM | POA: Insufficient documentation

## 2015-02-08 DIAGNOSIS — Z792 Long term (current) use of antibiotics: Secondary | ICD-10-CM | POA: Diagnosis not present

## 2015-02-08 DIAGNOSIS — L03031 Cellulitis of right toe: Secondary | ICD-10-CM | POA: Diagnosis not present

## 2015-02-08 DIAGNOSIS — L0390 Cellulitis, unspecified: Secondary | ICD-10-CM

## 2015-02-08 DIAGNOSIS — M79674 Pain in right toe(s): Secondary | ICD-10-CM | POA: Diagnosis present

## 2015-02-08 MED ORDER — TRAMADOL HCL 50 MG PO TABS
50.0000 mg | ORAL_TABLET | Freq: Four times a day (QID) | ORAL | Status: DC | PRN
Start: 2015-02-08 — End: 2015-09-09

## 2015-02-08 MED ORDER — BUPIVACAINE HCL (PF) 0.5 % IJ SOLN
10.0000 mL | Freq: Once | INTRAMUSCULAR | Status: AC
  Administered 2015-02-08: 10 mL
  Filled 2015-02-08: qty 10

## 2015-02-08 MED ORDER — DOXYCYCLINE HYCLATE 100 MG PO CAPS: 100.0000 mg | ORAL_CAPSULE | Freq: Two times a day (BID) | ORAL | Status: DC

## 2015-02-08 MED ORDER — DOXYCYCLINE HYCLATE 100 MG PO TABS
100.0000 mg | ORAL_TABLET | Freq: Once | ORAL | Status: AC
  Administered 2015-02-08: 100 mg via ORAL
  Filled 2015-02-08: qty 1

## 2015-02-08 MED ORDER — OXYCODONE-ACETAMINOPHEN 5-325 MG PO TABS
1.0000 | ORAL_TABLET | Freq: Once | ORAL | Status: AC
  Administered 2015-02-08: 1 via ORAL
  Filled 2015-02-08: qty 1

## 2015-02-08 NOTE — Discharge Instructions (Signed)
Cellulitis Follow-up with podiatry. Return for fever, increased redness or swelling, no improvement in 48 hours. Rest. Warm compresses. Elevate. Do not wear flip-flops. Keep your feet clean.   Cellulitis is an infection of the skin and the tissue under the skin. The infected area is usually red and tender. This happens most often in the arms and lower legs. HOME CARE   Take your antibiotic medicine as told. Finish the medicine even if you start to feel better.  Keep the infected arm or leg raised (elevated).  Put a warm cloth on the area up to 4 times per day.  Only take medicines as told by your doctor.  Keep all doctor visits as told. GET HELP IF:  You see red streaks on the skin coming from the infected area.  Your red area gets bigger or turns a dark color.  Your bone or joint under the infected area is painful after the skin heals.  Your infection comes back in the same area or different area.  You have a puffy (swollen) bump in the infected area.  You have new symptoms.  You have a fever. GET HELP RIGHT AWAY IF:   You feel very sleepy.  You throw up (vomit) or have watery poop (diarrhea).  You feel sick and have muscle aches and pains.   This information is not intended to replace advice given to you by your health care provider. Make sure you discuss any questions you have with your health care provider.   Document Released: 08/13/2007 Document Revised: 11/15/2014 Document Reviewed: 05/12/2011 Elsevier Interactive Patient Education [employer] Elsevier Inc.

## 2015-02-08 NOTE — ED Notes (Signed)
PT returns today for recheck of toe on RT foot. The third RT toe is red and swollen . The rt foot is also swollen. Pt reports she has been soaking foot as instructed and taking antibx. As directed.

## 2015-02-08 NOTE — ED Notes (Signed)
Declined W/C at D/C and was escorted to lobby by RN. 

## 2015-02-08 NOTE — ED Provider Notes (Signed)
CSN: [id number]     Arrival date & time 02/08/15  0751 History   First MD Initiated Contact with Patient 02/08/15 0800     Chief Complaint  Patient presents with  . Toe Pain   (Consider location/radiation/quality/duration/timing/severity/associated sxs/prior Treatment) HPI  Kristine Hurst is a 19 year old female who presents for right middle toe pain that began 9 days ago. She denies any injury to the toe. She states that she was seen here 2 days ago and had the toe lanced and packed. She was put on Bactrim. She had an x-ray done at that time. She states the pain has increased and has spread to the plantar surface of her foot. She has been taking ibuprofen for pain with minimal relief. She denies any drainage from the toe. She states it looks about the same as it did 2 days ago but the pain is worse.  She denies any fever, chills, nausea, vomiting.  Past Medical History  Diagnosis Date  . Seasonal allergies    Past Surgical History  Procedure Laterality Date  . Induced abortion  2013   Family History  Problem Relation Age of Onset  . Diabetes Other     Great gradparents paternal  . Diabetes Other     Great gradparents maternal  . Hypertension Maternal Grandmother   . Hypertension Maternal Grandfather   . Allergies Brother   . Allergies Maternal Grandfather   . Heart disease Other     great maternal grandparent  . Lung cancer Other     great maternal grandparent  . Pancreatic cancer Other     great maternal grandparent  . Melanoma Other     great maternal grandparent  . Asthma Mother   . Emphysema Other     great grandfather  . Heart attack Maternal Grandfather    Social History  Substance Use Topics  . Smoking status: Current Some Day Smoker -- 0.00 packs/day for 3 years    Types: Cigarettes  . Smokeless tobacco: None     Comment: Pt states she smoked less than 1 PPD  . Alcohol Use: No   OB History    Gravida Para Term Preterm AB TAB SAB Ectopic Multiple Living    1              Review of Systems  Constitutional: Negative for fever.  Skin: Positive for color change.      Allergies  Cefprozil  Home Medications   Prior to Admission medications   Medication Sig Start Date End Date Taking? Authorizing Provider  doxycycline (VIBRAMYCIN) 100 MG capsule Take 1 capsule (100 mg total) by mouth 2 (two) times daily. 02/08/15   Zylan Almquist Patel-Mills, PA-C  omeprazole (PRILOSEC) 40 MG capsule Take 1 capsule (40 mg total) by mouth daily. Take for 14 days then as needed 08/10/14   Ozella Rocks, MD  ondansetron (ZOFRAN) 4 MG tablet Take 1 tablet (4 mg total) by mouth every 8 (eight) hours as needed for nausea or vomiting. 08/10/14   Ozella Rocks, MD  sulfamethoxazole-trimethoprim (BACTRIM DS,SEPTRA DS) 800-160 MG tablet Take 1 tablet by mouth 2 (two) times daily. 02/06/15   Earley Favor, NP  traMADol (ULTRAM) 50 MG tablet Take 1 tablet (50 mg total) by mouth every 6 (six) hours as needed. 02/08/15   [name] Patel-Mills, PA-C   BP 109/69 mmHg  Pulse 68  Temp(Src) 97.4 F (36.3 C) (Oral)  Resp 20  Ht  (1.702 m)  Wt 115.667 kg  BMI  39.93 kg/m2  SpO2 100%  LMP 01/24/2015 Physical Exam  Constitutional: She is oriented to person, place, and time. She appears well-developed and well-nourished.  Crying on exam.  HENT:  Head: Normocephalic and atraumatic.  Eyes: Conjunctivae are normal.  Neck: Neck supple.  Cardiovascular: Normal rate.   Pulmonary/Chest: Effort normal. No respiratory distress.  Musculoskeletal: Normal range of motion.  Neurological: She is alert and oriented to person, place, and time.  Skin: Skin is warm and dry.  Right middle distal phalanx is warm, erythematous, swollen, and tender with packing in place. There is no streaking or erythema along the proximal phalanx or plantar surface of the foot.  There is a 3 mm size packings protruding from the toe around the base of the nail.  Nursing note and vitals reviewed.   ED Course  .Nerve  Block Date/Time: 02/08/2015 10:22 AM Performed by: Catha Gosselin Authorized by: Catha Gosselin Consent: Verbal consent obtained. Written consent obtained. Risks and benefits: risks, benefits and alternatives were discussed Consent given by: patient Patient understanding: patient states understanding of the procedure being performed Patient consent: the patient's understanding of the procedure matches consent given Procedure consent: procedure consent matches procedure scheduled Relevant documents: relevant documents present and verified Test results: test results available and properly labeled Site marked: the operative site was marked Imaging studies: imaging studies available Patient identity confirmed: verbally with patient Indications: pain relief Body area: lower extremity Nerve: digital Laterality: right Patient sedated: no Preparation: Patient was prepped and draped in the usual sterile fashion. Needle gauge: 25 G Location technique: anatomical landmarks Local anesthetic: bupivacaine 0.5% without epinephrine Anesthetic total: 5 ml Outcome: pain improved Patient tolerance: Patient tolerated the procedure well with no immediate complications Comments: Digital block was done to provide the patient comfort and remove packing. There was only a small piece of packing less than half centimeter that was removed. Patient stated that 2 inches were remaining in the foot. An x-ray was obtained to see if packing remained in the foot before re-lancing the toe.   (including critical care time) Labs Review Labs Reviewed - No data to display  Imaging Review Dg Toe 3rd Right  02/08/2015  CLINICAL DATA:  Right toe cellulitis. Ingrown toenail previously packed. EXAM: RIGHT THIRD TOE COMPARISON:  02/06/2015 FINDINGS: Persistent fairly extensive soft tissue swelling/edema involving the third toe. No gas is identified in the soft tissues. No definite findings to suggest septic arthritis or  osteomyelitis. No radiopaque foreign body is identified. IMPRESSION: Persistent diffuse soft tissue swelling involving the third toe. No plain film findings for septic arthritis or osteomyelitis. No radiopaque foreign body. Electronically Signed   By: Rudie Meyer M.D.   On: 02/08/2015 09:28   I have personally reviewed and evaluated these images results as part of my medical decision-making.   EKG Interpretation None      MDM   Final diagnoses:  Cellulitis  Patient presents for right middle toe infection and was seen here 2 days ago for the same. Toe x-ray done at that time that showed possible osteomyelitis. She had an I&D done and was put on Bactrim. She came in with packing in the toe. She states the pain has since worsened.  A small piece of packing was removed and the patient believed that more packing was in the toe. The wound has already healed and I did not want to re-lanced the foot if it was not necessary. A repeat x-ray was obtained to see if packing remained in the  toe. X-ray was negative. I discussed the findings with the patient. I changed her antibiotic from Bactrim to doxycycline. I explained how to do warm compresses.  She can continue epsom salt soaks. She had no osteomyelitis on x-ray. She is afebrile. She was given Ultram for pain. I discussed return precautions with the patient as well as follow-up and she verbally agrees with the plan. Medications  oxyCODONE-acetaminophen (PERCOCET/ROXICET) 5-325 MG per tablet 1 tablet (1 tablet Oral Given 02/08/15 0809)  bupivacaine (MARCAINE) 0.5 % injection 10 mL (10 mLs Infiltration Given 02/08/15 0810)  doxycycline (VIBRA-TABS) tablet 100 mg (100 mg Oral Given 02/08/15 1008)   Filed Vitals:   02/08/15 0802 02/08/15 1005  BP: 98/50 109/69  Pulse: 67 68  Temp: 97.4 F (36.3 C)   Resp: 18 [address]       Kristine Heslin Patel-Mills, PA-C 02/08/15 1317  Lyndal Pulleyaniel Knott, MD 02/09/15 [phone]

## 2015-09-08 ENCOUNTER — Encounter (HOSPITAL_COMMUNITY): Payer: Self-pay | Admitting: Emergency Medicine

## 2015-09-08 DIAGNOSIS — R109 Unspecified abdominal pain: Secondary | ICD-10-CM | POA: Insufficient documentation

## 2015-09-08 DIAGNOSIS — O0281 Inappropriate change in quantitative human chorionic gonadotropin (hCG) in early pregnancy: Secondary | ICD-10-CM | POA: Insufficient documentation

## 2015-09-08 DIAGNOSIS — Z3A01 Less than 8 weeks gestation of pregnancy: Secondary | ICD-10-CM | POA: Diagnosis not present

## 2015-09-08 DIAGNOSIS — F17210 Nicotine dependence, cigarettes, uncomplicated: Secondary | ICD-10-CM | POA: Insufficient documentation

## 2015-09-08 DIAGNOSIS — O210 Mild hyperemesis gravidarum: Secondary | ICD-10-CM | POA: Diagnosis not present

## 2015-09-08 DIAGNOSIS — O26891 Other specified pregnancy related conditions, first trimester: Secondary | ICD-10-CM | POA: Diagnosis not present

## 2015-09-08 DIAGNOSIS — Z79899 Other long term (current) drug therapy: Secondary | ICD-10-CM | POA: Insufficient documentation

## 2015-09-08 DIAGNOSIS — O99331 Smoking (tobacco) complicating pregnancy, first trimester: Secondary | ICD-10-CM | POA: Diagnosis not present

## 2015-09-08 LAB — COMPREHENSIVE METABOLIC PANEL
ALT: 20 U/L (ref 14–54)
AST: 21 U/L (ref 15–41)
Albumin: 3.4 g/dL — ABNORMAL LOW (ref 3.5–5.0)
Alkaline Phosphatase: 50 U/L (ref 38–126)
Anion gap: 7 (ref 5–15)
BUN: 8 mg/dL (ref 6–20)
CO2: 22 mmol/L (ref 22–32)
Calcium: 9.4 mg/dL (ref 8.9–10.3)
Chloride: 106 mmol/L (ref 101–111)
Creatinine, Ser: 0.84 mg/dL (ref 0.44–1.00)
GLUCOSE: 98 mg/dL (ref 65–99)
POTASSIUM: 3.4 mmol/L — AB (ref 3.5–5.1)
Sodium: 135 mmol/L (ref 135–145)
Total Bilirubin: 0.2 mg/dL — ABNORMAL LOW (ref 0.3–1.2)
Total Protein: 6.2 g/dL — ABNORMAL LOW (ref 6.5–8.1)

## 2015-09-08 LAB — LIPASE, BLOOD: LIPASE: 20 U/L (ref 11–51)

## 2015-09-08 LAB — URINALYSIS, ROUTINE W REFLEX MICROSCOPIC
BILIRUBIN URINE: NEGATIVE
GLUCOSE, UA: NEGATIVE mg/dL
Hgb urine dipstick: NEGATIVE
Ketones, ur: 15 mg/dL — AB
Leukocytes, UA: NEGATIVE
Nitrite: NEGATIVE
PH: 5.5 (ref 5.0–8.0)
Protein, ur: NEGATIVE mg/dL
SPECIFIC GRAVITY, URINE: 1.028 (ref 1.005–1.030)

## 2015-09-08 LAB — CBC
HEMATOCRIT: 40.2 % (ref 36.0–46.0)
HEMOGLOBIN: 13.5 g/dL (ref 12.0–15.0)
MCH: 32.1 pg (ref 26.0–34.0)
MCHC: 33.6 g/dL (ref 30.0–36.0)
MCV: 95.7 fL (ref 78.0–100.0)
Platelets: 306 10*3/uL (ref 150–400)
RBC: 4.2 MIL/uL (ref 3.87–5.11)
RDW: 12.4 % (ref 11.5–15.5)
WBC: 10.6 10*3/uL — ABNORMAL HIGH (ref 4.0–10.5)

## 2015-09-08 LAB — POC URINE PREG, ED: Preg Test, Ur: POSITIVE — AB

## 2015-09-08 NOTE — ED Notes (Signed)
Pt. reports mid abdominal pain with nausea and emesis onset today , denies fever or diarrhea .

## 2015-09-09 ENCOUNTER — Emergency Department (HOSPITAL_COMMUNITY): Payer: Managed Care, Other (non HMO)

## 2015-09-09 ENCOUNTER — Emergency Department (HOSPITAL_COMMUNITY)
Admission: EM | Admit: 2015-09-09 | Discharge: 2015-09-09 | Disposition: A | Discharge status: Home / Self Care | Payer: Managed Care, Other (non HMO) | Attending: Emergency Medicine | Admitting: Emergency Medicine

## 2015-09-09 DIAGNOSIS — R52 Pain, unspecified: Secondary | ICD-10-CM

## 2015-09-09 DIAGNOSIS — Z3491 Encounter for supervision of normal pregnancy, unspecified, first trimester: Secondary | ICD-10-CM

## 2015-09-09 LAB — WET PREP, GENITAL
Clue Cells Wet Prep HPF POC: NONE SEEN
Sperm: NONE SEEN
TRICH WET PREP: NONE SEEN
YEAST WET PREP: NONE SEEN

## 2015-09-09 LAB — HCG, QUANTITATIVE, PREGNANCY: HCG, BETA CHAIN, QUANT, S: 18225 m[IU]/mL — AB (ref <5)

## 2015-09-09 MED ORDER — PRENATAL COMPLETE 14-0.4 MG PO TABS: 1.0000 | ORAL_TABLET | Freq: Every day | ORAL | Status: DC

## 2015-09-09 NOTE — ED Notes (Signed)
Pt questioning  About getting blood from her she is afraid of needles  As i talked to her she agreed to allow a blood draw

## 2015-09-09 NOTE — Discharge Instructions (Signed)
First Trimester of Pregnancy  The first trimester of pregnancy is from week 1 until the end of week 12 (months 1 through 3). A week after a sperm fertilizes an egg, the egg will implant on the wall of the uterus. This embryo will begin to develop into a baby. Genes from you and your partner are forming the baby. The female genes determine whether the baby is a boy or a girl. At 6-8 weeks, the eyes and face are formed, and the heartbeat can be seen on ultrasound. At the end of 12 weeks, all the baby's organs are formed.   Now that you are pregnant, you will want to do everything you can to have a healthy baby. Two of the most important things are to get good prenatal care and to follow your health care provider's instructions. Prenatal care is all the medical care you receive before the baby's birth. This care will help prevent, find, and treat any problems during the pregnancy and childbirth.  BODY CHANGES  Your body goes through many changes during pregnancy. The changes vary from woman to woman.   · You may gain or lose a couple of pounds at first.  · You may feel sick to your stomach (nauseous) and throw up (vomit). If the vomiting is uncontrollable, call your health care provider.  · You may tire easily.  · You may develop headaches that can be relieved by medicines approved by your health care provider.  · You may urinate more often. Painful urination may mean you have a bladder infection.  · You may develop heartburn as a result of your pregnancy.  · You may develop constipation because certain hormones are causing the muscles that push waste through your intestines to slow down.  · You may develop hemorrhoids or swollen, bulging veins (varicose veins).  · Your breasts may begin to grow larger and become tender. Your nipples may stick out more, and the tissue that surrounds them (areola) may become darker.  · Your gums may bleed and may be sensitive to brushing and flossing.   · Dark spots or blotches (chloasma, mask of pregnancy) may develop on your face. This will likely fade after the baby is born.  · Your menstrual periods will stop.  · You may have a loss of appetite.  · You may develop cravings for certain kinds of food.  · You may have changes in your emotions from day to day, such as being excited to be pregnant or being concerned that something may go wrong with the pregnancy and baby.  · You may have more vivid and strange dreams.  · You may have changes in your hair. These can include thickening of your hair, rapid growth, and changes in texture. Some women also have hair loss during or after pregnancy, or hair that feels dry or thin. Your hair will most likely return to normal after your baby is born.  WHAT TO EXPECT AT YOUR PRENATAL VISITS  During a routine prenatal visit:  · You will be weighed to make sure you and the baby are growing normally.  · Your blood pressure will be taken.  · Your abdomen will be measured to track your baby's growth.  · The fetal heartbeat will be listened to starting around week 10 or 12 of your pregnancy.  · Test results from any previous visits will be discussed.  Your health care provider may ask you:  · How you are feeling.  · If you   including cigarettes, chewing tobacco, and electronic cigarettes. °· If you have any questions. °Other tests that may be performed during your first trimester include: °· Blood tests to find your blood type and to check for the presence of any previous infections. They will also be used to check for low iron levels (anemia) and Rh antibodies. Later in the pregnancy, blood tests for diabetes will be done along with other tests if problems develop. °· Urine tests to check for infections, diabetes, or protein in the urine. °· An ultrasound to confirm the proper growth  and development of the baby. °· An amniocentesis to check for possible genetic problems. °· Fetal screens for spina bifida and Down syndrome. °· You may need other tests to make sure you and the baby are doing well. °· HIV (human immunodeficiency virus) testing. Routine prenatal testing includes screening for HIV, unless you choose not to have this test. °HOME CARE INSTRUCTIONS  °Medicines °· Follow your health care provider's instructions regarding medicine use. Specific medicines may be either safe or unsafe to take during pregnancy. °· Take your prenatal vitamins as directed. °· If you develop constipation, try taking a stool softener if your health care provider approves. °Diet °· Eat regular, well-balanced meals. Choose a variety of foods, such as meat or vegetable-based protein, fish, milk and low-fat dairy products, vegetables, fruits, and whole grain breads and cereals. Your health care provider will help you determine the amount of weight gain that is right for you. °· Avoid raw meat and uncooked cheese. These carry germs that can cause birth defects in the baby. °· Eating four or five small meals rather than three large meals a day may help relieve nausea and vomiting. If you start to feel nauseous, eating a few soda crackers can be helpful. Drinking liquids between meals instead of during meals also seems to help nausea and vomiting. °· If you develop constipation, eat more high-fiber foods, such as fresh vegetables or fruit and whole grains. Drink enough fluids to keep your urine clear or pale yellow. °Activity and Exercise °· Exercise only as directed by your health care provider. Exercising will help you: °¨ Control your weight. °¨ Stay in shape. °¨ Be prepared for labor and delivery. °· Experiencing pain or cramping in the lower abdomen or low back is a good sign that you should stop exercising. Check with your health care provider before continuing normal exercises. °· Try to avoid standing for long  periods of time. Move your legs often if you must stand in one place for a long time. °· Avoid heavy lifting. °· Wear low-heeled shoes, and practice good posture. °· You may continue to have sex unless your health care provider directs you otherwise. °Relief of Pain or Discomfort °· Wear a good support bra for breast tenderness.   °· Take warm sitz baths to soothe any pain or discomfort caused by hemorrhoids. Use hemorrhoid cream if your health care provider approves.   °· Rest with your legs elevated if you have leg cramps or low back pain. °· If you develop varicose veins in your legs, wear support hose. Elevate your feet for 15 minutes, 3-4 times a day. Limit salt in your diet. °Prenatal Care °· Schedule your prenatal visits by the twelfth week of pregnancy. They are usually scheduled monthly at first, then more often in the last 2 months before delivery. °· Write down your questions. Take them to your prenatal visits. °· Keep all your prenatal visits as directed by your   health care provider. Safety  Wear your seat belt at all times when driving.  Make a list of emergency phone numbers, including numbers for family, friends, the hospital, and police and fire departments. General Tips  Ask your health care provider for a referral to a local prenatal education class. Begin classes no later than at the beginning of month 6 of your pregnancy.  Ask for help if you have counseling or nutritional needs during pregnancy. Your health care provider can offer advice or refer you to specialists for help with various needs.  Do not use hot tubs, steam rooms, or saunas.  Do not douche or use tampons or scented sanitary pads.  Do not cross your legs for long periods of time.  Avoid cat litter boxes and soil used by cats. These carry germs that can cause birth defects in the baby and possibly loss of the fetus by miscarriage or stillbirth.  Avoid all smoking, herbs, alcohol, and medicines not prescribed by  your health care provider. Chemicals in these affect the formation and growth of the baby.  Do not use any tobacco products, including cigarettes, chewing tobacco, and electronic cigarettes. If you need help quitting, ask your health care provider. You may receive counseling support and other resources to help you quit.  Schedule a dentist appointment. At home, brush your teeth with a soft toothbrush and be gentle when you floss. SEEK MEDICAL CARE IF:   You have dizziness.  You have mild pelvic cramps, pelvic pressure, or nagging pain in the abdominal area.  You have persistent nausea, vomiting, or diarrhea.  You have a bad smelling vaginal discharge.  You have pain with urination.  You notice increased swelling in your face, hands, legs, or ankles. SEEK IMMEDIATE MEDICAL CARE IF:   You have a fever.  You are leaking fluid from your vagina.  You have spotting or bleeding from your vagina.  You have severe abdominal cramping or pain.  You have rapid weight gain or loss.  You vomit blood or material that looks like coffee grounds.  You are exposed to [place] measles and have never had them.  You are exposed to fifth disease or chickenpox.  You develop a severe headache.  You have shortness of breath.  You have any kind of trauma, such as from a fall or a car accident.   This information is not intended to replace advice given to you by your health care provider. Make sure you discuss any questions you have with your health care provider.   Document Released: 02/18/2001 Document Revised: 03/17/2014 Document Reviewed: 01/04/2013 Elsevier Interactive Patient Education 2016 [employer]    [email or web address] [address] [place], [place] [phone]    [facility] [email or web address] [address] [place], [place] [phone]    [facility] [address] [place], [place] [phone]   [facility] [email or web address] [address] [place], [place] [phone]   [facility] [email or web address] [address] [place], [place] [phone]   [email or web address] [address] [place], [place] [phone]

## 2015-09-09 NOTE — ED Notes (Signed)
To us now 

## 2015-09-09 NOTE — ED Notes (Signed)
Pelvic specs to the lab

## 2015-09-09 NOTE — ED Notes (Signed)
The pt returned from us 

## 2015-09-09 NOTE — ED Provider Notes (Signed)
By signing my name below, I, Suzan SlickAshley N. Elon SpannerLeger, attest that this documentation has been prepared under the direction and in the presence of Kristen N Ward, DO.  Electronically Signed: Suzan SlickAshley N. Elon SpannerLeger, ED Scribe. 09/09/2015. 1:36 AM.   TIME SEEN: 1:33 AM   CHIEF COMPLAINT:  Chief Complaint  Patient presents with  . Abdominal Pain     HPI:  HPI Comments: Kristine Hurst, G3P0A2 is a 20 y.o. female without any pertinent past medical history who presents to the Emergency Department complaining of constant, unchanged, mild cramping abdominal pain x 3-4 hours. No aggravating or alleviating factors at this time. She also reports nausea and vomiting x 1 day. No OTC medications or home remedies attempted prior to arrival. No recent fever, chills, diarrhea, vaginal bleeding, or vaginal discharge. LNMP Jul 22, 2015. [redacted] weeks pregnant by LMP.  No prior history of STDs.  States Symptoms feel similar to when she had a spontaneous abortion.  PCP: DIXON,MARY BETH, PA-C    ROS: See HPI Constitutional: no fever  Eyes: no drainage  ENT: no runny nose   Cardiovascular:  no chest pain  Resp: no SOB  GI: Positive nausea, abdominal pain, vomiting GU: no dysuria Integumentary: no rash  Allergy: no hives  Musculoskeletal: no leg swelling  Neurological: no slurred speech ROS otherwise negative  PAST MEDICAL HISTORY/PAST SURGICAL HISTORY:  Past Medical History  Diagnosis Date  . Seasonal allergies     MEDICATIONS:  Prior to Admission medications   Medication Sig Start Date End Date Taking? Authorizing Provider  doxycycline (VIBRAMYCIN) 100 MG capsule Take 1 capsule (100 mg total) by mouth 2 (two) times daily. 02/08/15   Hanna Patel-Mills, PA-C  omeprazole (PRILOSEC) 40 MG capsule Take 1 capsule (40 mg total) by mouth daily. Take for 14 days then as needed 08/10/14   Ozella Rocksavid J Merrell, MD  ondansetron (ZOFRAN) 4 MG tablet Take 1 tablet (4 mg total) by mouth every 8 (eight) hours as needed for nausea or  vomiting. 08/10/14   Ozella Rocksavid J Merrell, MD  sulfamethoxazole-trimethoprim (BACTRIM DS,SEPTRA DS) 800-160 MG tablet Take 1 tablet by mouth 2 (two) times daily. 02/06/15   Earley FavorGail Schulz, NP  traMADol (ULTRAM) 50 MG tablet Take 1 tablet (50 mg total) by mouth every 6 (six) hours as needed. 02/08/15   Catha GosselinHanna Patel-Mills, PA-C    ALLERGIES:  Allergies  Allergen Reactions  . Cefprozil     *CEFZIL*    SOCIAL HISTORY:  Social History  Substance Use Topics  . Smoking status: Current Some Day Smoker -- 0.00 packs/day for 3 years    Types: Cigarettes  . Smokeless tobacco: Not on file     Comment: Pt states she smoked less than 1 PPD  . Alcohol Use: No    FAMILY HISTORY: Family History  Problem Relation Age of Onset  . Diabetes Other     Great gradparents paternal  . Diabetes Other     Great gradparents maternal  . Hypertension Maternal Grandmother   . Hypertension Maternal Grandfather   . Allergies Brother   . Allergies Maternal Grandfather   . Heart disease Other     great maternal grandparent  . Lung cancer Other     great maternal grandparent  . Pancreatic cancer Other     great maternal grandparent  . Melanoma Other     great maternal grandparent  . Asthma Mother   . Emphysema Other     great grandfather  . Heart attack Maternal Grandfather  EXAM: BP 117/82 mmHg  Pulse 92  Temp(Src) 98.5 F (36.9 C) (Oral)  Resp 18  Ht 5\' 6"  (1.676 m)  Wt 269 lb (122.018 kg)  BMI 43.44 kg/m2  SpO2 97%  LMP 08/25/2015 (Approximate) CONSTITUTIONAL: Alert and oriented and responds appropriately to questions. Well-appearing; well-nourished; obese HEAD: Normocephalic EYES: Conjunctivae clear, PERRL ENT: normal nose; no rhinorrhea; moist mucous membranes NECK: Supple, no meningismus, no LAD  CARD: RRR; S1 and S2 appreciated; no murmurs, no clicks, no rubs, no gallops RESP: Normal chest excursion without splinting or tachypnea; breath sounds clear and equal bilaterally; no wheezes, no  rhonchi, no rales, no hypoxia or respiratory distress, speaking full sentences ABD/GI: Normal bowel sounds; non-distended; soft, non-tender, no rebound, no guarding, no peritoneal signs GU:  Normal external genitalia. No lesions, rashes noted. Patient has no vaginal bleeding on exam. Thick white vaginal discharge.  No adnexal tenderness, mass or fullness, no cervical motion tenderness. Cervix is not appear friable.  Cervix is closed.  Chaperone present for exam. BACK:  The back appears normal and is non-tender to palpation, there is no CVA tenderness EXT: Normal ROM in all joints; non-tender to palpation; no edema; normal capillary refill; no cyanosis, no calf tenderness or swelling    SKIN: Normal color for age and race; warm; no rash NEURO: Moves all extremities equally, sensation to light touch intact diffusely, cranial nerves II through XII intact PSYCH: The patient's mood and manner are appropriate. Grooming and personal hygiene are appropriate.  MEDICAL DECISION MAKING: Patient here with abdominal pain, nausea. Does have a positive urine pregnancy test. Will obtain a quant hCG.  We'll perform pelvic exam with cultures and transvaginal ultrasound.  ED PROGRESS: Patient's beta hCG is 18,225. Pelvic ultrasound shows a single intrauterine pregnancy measuring 6 weeks and 1 day by crown-rump length. Right ovary appears normal with normal perfusion. Unable to visualize left ovary but she does not have adnexal tenderness in this area. Abdominal exam remains benign. Does have some thick white vaginal discharge on exam. Scarring Chlamydia cultures are pending. No cervical motion tenderness. Wet prep shows white blood cells but no other acute abnormality. No suspicion for PID based on her exam. I feel she is safe to be discharged. We'll give her OB/GYN follow-up information.  Discussed with her return precautions. Again she is not having any vaginal bleeding. We'll give her a work note. She verbalized  understanding and is comfortable with this plan.   At this time, I do not feel there is any life-threatening condition present. I have reviewed and discussed all results (EKG, imaging, lab, urine as appropriate), exam findings with patient. I have reviewed nursing notes and appropriate previous records.  I feel the patient is safe to be discharged home without further emergent workup. Discussed usual and customary return precautions. Patient and family (if present) verbalize understanding and are comfortable with this plan.  Patient will follow-up with their primary care provider. If they do not have a primary care provider, information for follow-up has been provided to them. All questions have been answered.       I personally performed the services described in this documentation, which was scribed in my presence. The recorded information has been reviewed and is accurate.   Layla MawKristen N Ward, DO 09/09/15 [phone]

## 2015-09-09 NOTE — ED Notes (Signed)
lmp may 14  abd pain nausea vomiting

## 2015-09-10 LAB — GC/CHLAMYDIA PROBE AMP (~~LOC~~) NOT AT ARMC
CHLAMYDIA, DNA PROBE: NEGATIVE
NEISSERIA GONORRHEA: NEGATIVE

## 2015-09-24 ENCOUNTER — Ambulatory Visit (INDEPENDENT_AMBULATORY_CARE_PROVIDER_SITE_OTHER): Payer: Managed Care, Other (non HMO) | Admitting: Physician Assistant

## 2015-09-24 ENCOUNTER — Encounter: Payer: Self-pay | Admitting: Physician Assistant

## 2015-09-24 VITALS — BP 126/82 | Temp 98.2°F | Wt 273.0 lb

## 2015-09-24 DIAGNOSIS — R110 Nausea: Secondary | ICD-10-CM | POA: Diagnosis not present

## 2015-09-24 DIAGNOSIS — Z3490 Encounter for supervision of normal pregnancy, unspecified, unspecified trimester: Secondary | ICD-10-CM

## 2015-09-24 LAB — PREGNANCY, URINE: Preg Test, Ur: POSITIVE — AB

## 2015-09-24 MED ORDER — ONDANSETRON HCL 8 MG PO TABS: 8.0000 mg | ORAL_TABLET | Freq: Three times a day (TID) | ORAL | Status: DC | PRN

## 2015-09-24 NOTE — Progress Notes (Signed)
    Patient ID: Kristine Hurst MRN: [id number], DOB: April 21, 1995, 20 y.o. Date of Encounter: 09/24/2015, 5:11 PM    Chief Complaint:  Chief Complaint  Patient presents with  . OTHER    establish care     HPI: 20 y.o. year old female presents as a New Patient to Establish Care.   She states that she is pregnant. States that her last menstrual period was May 17. States that she needs medication for nausea. She does not have an OB/GYN yet. Says that she has nausea all day every day. Says that she does vomit-- some days vomits 4 times a day;  some days only twice a day. No other complaints or concerns.  She is taking prenatal vitamin daily. She is on no prescription medications. She is using no alcohol tobacco or illicit drugs.     Home Meds:   Outpatient Prescriptions Prior to Visit  Medication Sig Dispense Refill  . Prenatal Vit-Fe Fumarate-FA (PRENATAL COMPLETE) 14-0.4 MG TABS Take 1 tablet by mouth daily. 90 each 4   No facility-administered medications prior to visit.    Allergies:  Allergies  Allergen Reactions  . Cefprozil     *CEFZIL*  . Cefprozil Other (See Comments)    Unknown reaction      Review of Systems: See HPI for pertinent ROS. All other ROS negative.    Physical Exam: Blood pressure 126/82, temperature 98.2 F (36.8 C), weight 273 lb (123.832 kg), last menstrual period 08/25/2015., Body mass index is 44.08 kg/(m^2). General:  Obese WF. Appears in no acute distress. Neck: Supple. No thyromegaly. No lymphadenopathy. Lungs: Clear bilaterally to auscultation without wheezes, rales, or rhonchi. Breathing is unlabored. Heart: Regular rhythm. No murmurs, rubs, or gallops. Msk:  Strength and tone normal for age. Extremities/Skin: Warm and dry. Neuro: Alert and oriented X 3. Moves all extremities spontaneously. Gait is normal. CNII-XII grossly in tact. Psych:  Responds to questions appropriately with a normal affect.   Results for orders placed or  performed in visit on 09/24/15  Pregnancy, urine  Result Value Ref Range   Preg Test, Ur POSITIVE (A) NEGATIVE     ASSESSMENT AND PLAN:  20 y.o. year old female with  1. Pregnancy She can use the Zofran as needed for nausea. I have placed order for referral for OB GYN. She is to continue the prenatal vitamins. - Pregnancy, urine - ondansetron (ZOFRAN) 8 MG tablet; Take 1 tablet (8 mg total) by mouth every 8 (eight) hours as needed for nausea or vomiting.  Dispense: 30 tablet; Refill: 1 - Ambulatory referral to Obstetrics / Gynecology   Signed, [address]Mary Beth Pocono Springs[place]ixon, [place], [facility] 09/24/2015 5:11 PM

## 2015-09-26 ENCOUNTER — Telehealth: Payer: Self-pay | Admitting: Family Medicine

## 2015-09-26 NOTE — Telephone Encounter (Signed)
Pt is still having nausea and is taking the Zofran in the AM around 8 and then again at lunch but nothing after that. She is very inconsistent with her explanation of how the meds work and about her nausea.  Sometimes it helps sometimes it don't and although it does help she does not take one after lunch on BID. She has also been having some dizziness and lightheadness over the last week but again very inconsistent with this as well as she was seen on 09/24/15 and there was no mention of this but it has been going on for a week, on and off, not every day but most days.   Per MBD pt needs to wait for OB/GYN appt as the zofran is all she can take as of right now. Pt can take Zofran q 8 hours which is tid.   [facility]

## 2015-10-04 ENCOUNTER — Ambulatory Visit (HOSPITAL_COMMUNITY)
Admission: EM | Admit: 2015-10-04 | Discharge: 2015-10-04 | Disposition: A | Discharge status: Home / Self Care | Payer: Managed Care, Other (non HMO) | Attending: Family Medicine | Admitting: Family Medicine

## 2015-10-04 DIAGNOSIS — O210 Mild hyperemesis gravidarum: Secondary | ICD-10-CM

## 2015-10-04 DIAGNOSIS — H9203 Otalgia, bilateral: Secondary | ICD-10-CM

## 2015-10-04 DIAGNOSIS — K5900 Constipation, unspecified: Secondary | ICD-10-CM

## 2015-10-04 MED ORDER — VITAMIN B-6 50 MG PO TABS: 25.0000 mg | ORAL_TABLET | Freq: Three times a day (TID) | ORAL | 0 refills | Status: DC | PRN

## 2015-10-04 MED ORDER — DOXYLAMINE SUCCINATE (SLEEP) 25 MG PO TABS: 12.5000 mg | ORAL_TABLET | Freq: Two times a day (BID) | ORAL | 0 refills | Status: DC | PRN

## 2015-10-04 NOTE — ED Provider Notes (Signed)
[facility]    CSN: [id number] Arrival date & time: 10/04/15  3817  First Provider Contact:  None       History   Chief Complaint No chief complaint on file.   HPI Kristine Hurst is a 20 y.o. female.   HPI   Yellow stool: 3 days. stooling is more difficult. More constipated. Taking PNV for past 10 days. Associated w/ gassy type pains. Increased water w/o benefit.   Ear pain: bilat. 7 days ago. Started in R but now L as well. Radiates into jaw. No discharge. Harder to hear. URI symptoms 7 days ago.   Nausea and vomiting since onset of pregnancy. Zofran 8 mg without improvement.  [redacted] wks pregnant. Currently seeing family doctor   Denies any vaginal discharge, gush of fluid, contractions, fevers, dysuria, frequency, back pain, headache, LOC, palpitations, chest pain, shortness of breath   Past Medical History:  Diagnosis Date  . Seasonal allergies     Patient Active Problem List   Diagnosis Date Noted  . Acute bronchitis 05/17/2012  . Abnormal CT scan, chest 05/17/2012    Past Surgical History:  Procedure Laterality Date  . INDUCED ABORTION  2013    OB History    Gravida Para Term Preterm AB Living   1             SAB TAB Ectopic Multiple Live Births                   Home Medications    Prior to Admission medications   Medication Sig Start Date End Date Taking? Authorizing Provider  doxylamine, Sleep, (UNISOM) 25 MG tablet Take 0.5-1 tablets (12.5-25 mg total) by mouth 2 (two) times daily as needed (nauysea). 10/04/15   Ozella Rocks, MD  ondansetron (ZOFRAN) 8 MG tablet Take 1 tablet (8 mg total) by mouth every 8 (eight) hours as needed for nausea or vomiting. 09/24/15   Dorena Bodo, PA-C  Prenatal Vit-Fe Fumarate-FA (PRENATAL COMPLETE) 14-0.4 MG TABS Take 1 tablet by mouth daily. 09/09/15   Kristen N Ward, DO  pyridOXINE (VITAMIN B-6) 50 MG tablet Take 0.5 tablets (25 mg total) by mouth 3 (three) times daily as needed (nausea). 10/04/15   Ozella Rocks, MD    Family History Family History  Problem Relation Age of Onset  . Diabetes Other     Great gradparents paternal  . Diabetes Other     Great gradparents maternal  . Hypertension Maternal Grandmother   . Hypertension Maternal Grandfather   . Allergies Brother   . Allergies Maternal Grandfather   . Heart disease Other     great maternal grandparent  . Lung cancer Other     great maternal grandparent  . Pancreatic cancer Other     great maternal grandparent  . Melanoma Other     great maternal grandparent  . Asthma Mother   . Emphysema Other     great grandfather  . Heart attack Maternal Grandfather     Social History Social History  Substance Use Topics  . Smoking status: Current Some Day Smoker    Packs/day: 0.00    Years: 3.00    Types: Cigarettes  . Smokeless tobacco: Not on file     Comment: Pt states she is down to 3-4 cigarrettes a day  . Alcohol use No     Allergies   Cefprozil and Cefprozil   Review of Systems Review of Systems  Per HPI with all  other pertinent systems negative.   Physical Exam Triage Vital Signs ED Triage Vitals [10/04/15 1940]  Enc Vitals Group     BP 120/86     Pulse Rate 82     Resp 16     Temp 99.3 F (37.4 C)     Temp Source Oral     SpO2 97 %     Weight      Height      Head Circumference      Peak Flow      Pain Score      Pain Loc      Pain Edu?      Excl. in GC?    No data found.   Updated Vital Signs BP 120/86 (BP Location: Left Arm)   Pulse 82   Temp 99.3 F (37.4 C) (Oral)   Resp 16   LMP 08/25/2015 (Approximate)   SpO2 97%   Visual Acuity Right Eye Distance:   Left Eye Distance:   Bilateral Distance:    Right Eye Near:   Left Eye Near:    Bilateral Near:     Physical Exam Physical Exam  Constitutional: oriented to person, place, and time. appears well-developed and well-nourished. No distress.  HENT:  TMs normal bilaterally Head: Normocephalic and atraumatic.  Eyes:  EOMI. PERRL.  Neck: Normal range of motion.  Cardiovascular: RRR, no m/r/g, 2+ distal pulses,  Pulmonary/Chest: Effort normal and breath sounds normal. No respiratory distress.  Abdominal: Soft. Bowel sounds are normal. NonTTP, no distension.  Musculoskeletal: Normal range of motion. Non ttp, no effusion.  Neurological: alert and oriented to person, place, and time.  Skin: Skin is warm. No rash noted. non diaphoretic.  Psychiatric: normal mood and affect. behavior is normal. Judgment and thought content normal.    UC Treatments / Results  Labs (all labs ordered are listed, but only abnormal results are displayed) Labs Reviewed - No data to display  EKG  EKG Interpretation None       Radiology No results found.  Procedures Procedures (including critical care time)  Medications Ordered in UC Medications - No data to display   Initial Impression / Assessment and Plan / UC Course  I have reviewed the triage vital signs and the nursing notes.  Pertinent labs & imaging results that were available during my care of the patient were reviewed by me and considered in my medical decision making (see chart for details).  Clinical Course    Morning sickness. We'll prescribe a limited dose of Unisom and vitamin B6. Counseled patient very strongly to call women's clinic to establish care with the OB/GYN practice there. She states that she's been unable for the past week and a half to obtain an OB appointment at a different office was recommended by her PCP. Her PCP is uncomfortable with further OB management at this time.  Constipation. This: Sides with starting of her prenatal vitamin. I suspect this is probably the problem along with pregnancy. Recommend patient go to the pharmacy and obtain a prenatal vitamin as a stool softener in it. Further management can be done through her PCPs office or through her OB. Cannot confirm held discard her stools are but suspect that if this continues  she'll need a metabolic panel to further evaluate any significant abnormality by suspect again that this discoloration is likely fairly slight and related to her prenatal vitamin and pregnancy.   Ear pain. Started after URI. TMs do not show any signs of infection.  Suspect this is from eustachian tube dysfunction after recent URI and pregnancy. Recommend patient use Tylenol predominantly with a limited amount of ibuprofen use.  Final Clinical Impressions(s) / UC Diagnoses   Final diagnoses:  Constipation, unspecified constipation type  Morning sickness  Ear pain, bilateral    New Prescriptions Current Discharge Medication List    START taking these medications   Details  doxylamine, Sleep, (UNISOM) 25 MG tablet Take 0.5-1 tablets (12.5-25 mg total) by mouth 2 (two) times daily as needed (nauysea). Qty: 30 tablet, Refills: 0    pyridOXINE (VITAMIN B-6) 50 MG tablet Take 0.5 tablets (25 mg total) by mouth 3 (three) times daily as needed (nausea). Qty: 21 tablet, Refills: 0         Ozella Rocks, MD 10/04/15 2053

## 2015-10-04 NOTE — ED Triage Notes (Signed)
Patient c/o bilateral ear pain, yellow stools and constipation, and nausea x 4 days. She is [redacted] weeks pregnant. She has some concerns about her diet and what she should eat. Patient is in NAD.

## 2015-10-04 NOTE — Discharge Instructions (Signed)
The predominant cause of your symptoms is pregnancy. You may use Tylenol 1000 mg every 6-8 hours for pain. On a limited basis you may use ibuprofen 200-400 mg every 4-6 hours for pain relief. Please consider using the Unisom and vitamin B6 for additional nausea relief. Please call the women's clinic at [facility] to establish prenatal care. Please call tomorrow, 10/05/2015. Posterior bowel complaints are likely coming from your prenatal vitamin as well as pregnancy causing worsening constipation. Please consider getting a prenatal vitamin that has a laxative in it. Best of luck

## 2015-10-25 LAB — OB RESULTS CONSOLE PLATELET COUNT: Platelets: 293 10*3/uL

## 2015-10-25 LAB — OB RESULTS CONSOLE ANTIBODY SCREEN: ANTIBODY SCREEN: NEGATIVE

## 2015-10-25 LAB — OB RESULTS CONSOLE ABO/RH: RH TYPE: POSITIVE

## 2015-10-25 LAB — OB RESULTS CONSOLE RUBELLA ANTIBODY, IGM: RUBELLA: NON-IMMUNE/NOT IMMUNE

## 2015-10-25 LAB — OB RESULTS CONSOLE HGB/HCT, BLOOD
HCT: 39 %
Hemoglobin: 13.4 g/dL

## 2015-10-25 LAB — OB RESULTS CONSOLE HIV ANTIBODY (ROUTINE TESTING): HIV: NONREACTIVE

## 2015-10-25 LAB — OB RESULTS CONSOLE RPR: RPR: NONREACTIVE

## 2015-10-25 LAB — OB RESULTS CONSOLE HEPATITIS B SURFACE ANTIGEN: Hepatitis B Surface Ag: NEGATIVE

## 2015-11-08 ENCOUNTER — Ambulatory Visit (INDEPENDENT_AMBULATORY_CARE_PROVIDER_SITE_OTHER): Payer: Managed Care, Other (non HMO) | Admitting: Family Medicine

## 2015-11-08 ENCOUNTER — Encounter: Payer: Self-pay | Admitting: Family Medicine

## 2015-11-08 VITALS — BP 113/81 | HR 80 | Wt 260.0 lb

## 2015-11-08 DIAGNOSIS — E669 Obesity, unspecified: Secondary | ICD-10-CM

## 2015-11-08 DIAGNOSIS — O9989 Other specified diseases and conditions complicating pregnancy, childbirth and the puerperium: Secondary | ICD-10-CM

## 2015-11-08 DIAGNOSIS — O99210 Obesity complicating pregnancy, unspecified trimester: Secondary | ICD-10-CM

## 2015-11-08 DIAGNOSIS — Z283 Underimmunization status: Secondary | ICD-10-CM

## 2015-11-08 DIAGNOSIS — Z3490 Encounter for supervision of normal pregnancy, unspecified, unspecified trimester: Secondary | ICD-10-CM | POA: Insufficient documentation

## 2015-11-08 DIAGNOSIS — O09899 Supervision of other high risk pregnancies, unspecified trimester: Secondary | ICD-10-CM | POA: Insufficient documentation

## 2015-11-08 DIAGNOSIS — Z3402 Encounter for supervision of normal first pregnancy, second trimester: Secondary | ICD-10-CM

## 2015-11-08 DIAGNOSIS — O99212 Obesity complicating pregnancy, second trimester: Secondary | ICD-10-CM

## 2015-11-08 DIAGNOSIS — F4329 Adjustment disorder with other symptoms: Secondary | ICD-10-CM

## 2015-11-08 DIAGNOSIS — Z2839 Other underimmunization status: Secondary | ICD-10-CM

## 2015-11-08 NOTE — Progress Notes (Signed)
Pt here for initial prenatal visit. Transferred from [facility]. Prenatal labs abstracted. Bedside US shows single IUP with FHR 160 and HC measuring 3737w1d

## 2015-11-08 NOTE — Progress Notes (Signed)
  Subjective:    Kristine Hurst is a G3P0020 60w5dbeing seen today for her first obstetrical visit.  She was seen twice at [facility], but transferred here for continued care.  Had PNLs done there.  Her obstetrical history is significant for obesity. Patient does intend to breast feed. Pregnancy history fully reviewed.  Patient reports nausea and increased stress.  There were no vitals filed for this visit.  HISTORY: OB History  Gravida Para Term Preterm AB Living  3 0 0 0 2 0  SAB TAB Ectopic Multiple Live Births  1 1 0 0 0    # Outcome Date GA Lbr Len/2nd Weight Sex Delivery Anes PTL Lv  3 Current           2 SAB      SAB     1 TAB      TAB        Birth Comments: System Generated. Please review and update pregnancy details.     Past Medical History:  Diagnosis Date  . Seasonal allergies    Past Surgical History:  Procedure Laterality Date  . INDUCED ABORTION  2013   Family History  Problem Relation Age of Onset  . Diabetes Other     Great gradparents paternal  . Diabetes Other     Great gradparents maternal  . Hypertension Maternal Grandmother   . Hypertension Maternal Grandfather   . Allergies Brother   . Allergies Maternal Grandfather   . Heart disease Other     great maternal grandparent  . Lung cancer Other     great maternal grandparent  . Pancreatic cancer Other     great maternal grandparent  . Melanoma Other     great maternal grandparent  . Asthma Mother   . Emphysema Other     great grandfather  . Heart attack Maternal Grandfather      Exam    Uterus:     Pelvic Exam:   System:     Skin: normal coloration and turgor, no rashes    Neurologic: gait normal; reflexes normal and symmetric   Extremities: normal strength, tone, and muscle mass   HEENT PERRLA and extra ocular movement intact   Mouth/Teeth mucous membranes moist, pharynx normal without lesions   Neck supple and no masses   Cardiovascular: regular rate and rhythm, no  murmurs or gallops   Respiratory:  appears well, vitals normal, no respiratory distress, acyanotic, normal RR, ear and throat exam is normal, neck free of mass or lymphadenopathy, chest clear, no wheezing, crepitations, rhonchi, normal symmetric air entry   Abdomen: soft, non-tender; bowel sounds normal; no masses,  no organomegaly      Assessment:    Pregnancy: [id number] Active Problem List   Diagnosis Date Noted  . Supervision of normal first pregnancy, antepartum 11/08/2015  . Rubella non-immune status, antepartum 11/08/2015  . Obesity affecting pregnancy, antepartum 11/08/2015  . Acute bronchitis 05/17/2012  . Abnormal CT scan, chest 05/17/2012        Plan:     1. Supervision of normal first pregnancy, antepartum, second trimester FHT and FH normal - [place] OB COMP + 14 WK; Future  2. Rubella non-immune status, antepartum MMR postpartum  3. Obesity affecting pregnancy, antepartum, second trimester   4. Stress and adjustment reaction [facility]    SLoma BostonJEHIEL 11/08/2015

## 2015-11-13 ENCOUNTER — Encounter: Payer: Self-pay | Admitting: *Deleted

## 2015-11-14 ENCOUNTER — Institutional Professional Consult (permissible substitution): Payer: Managed Care, Other (non HMO)

## 2015-11-14 ENCOUNTER — Encounter: Payer: Self-pay | Admitting: *Deleted

## 2015-11-21 ENCOUNTER — Other Ambulatory Visit (INDEPENDENT_AMBULATORY_CARE_PROVIDER_SITE_OTHER): Payer: Managed Care, Other (non HMO) | Admitting: *Deleted

## 2015-11-21 DIAGNOSIS — Z3480 Encounter for supervision of other normal pregnancy, unspecified trimester: Secondary | ICD-10-CM | POA: Diagnosis not present

## 2015-11-21 NOTE — Progress Notes (Signed)
   CLINIC ENCOUNTER NOTE  Subjective:  20 y.o. [id number] here today for Early 2h GTT and Pre-Eclampsia screening baseline labs. She denies concerns today.   Objective:  Oriented, well appearance   Assessment & Plan:  Early 2h GTT and pre-eclampsia baseline labs

## 2015-11-22 LAB — COMPREHENSIVE METABOLIC PANEL
ALBUMIN: 3.6 g/dL (ref 3.6–5.1)
ALK PHOS: 39 U/L (ref 33–115)
ALT: 23 U/L (ref 6–29)
AST: 17 U/L (ref 10–30)
BILIRUBIN TOTAL: 0.2 mg/dL (ref 0.2–1.2)
BUN: 11 mg/dL (ref 7–25)
CALCIUM: 8.9 mg/dL (ref 8.6–10.2)
CO2: 21 mmol/L (ref 20–31)
CREATININE: 0.57 mg/dL (ref 0.50–1.10)
Chloride: 105 mmol/L (ref 98–110)
GLUCOSE: 72 mg/dL (ref 65–99)
Potassium: 4.3 mmol/L (ref 3.5–5.3)
SODIUM: 135 mmol/L (ref 135–146)
Total Protein: 6.1 g/dL (ref 6.1–8.1)

## 2015-11-22 LAB — PROTEIN / CREATININE RATIO, URINE
CREATININE, URINE: 79 mg/dL (ref 20–320)
Protein Creatinine Ratio: 63 mg/g creat (ref 21–161)
Total Protein, Urine: 5 mg/dL (ref 5–24)

## 2015-11-22 LAB — GLUCOSE TOLERANCE, 2 HOURS
GLUCOSE, FASTING: 74 mg/dL (ref 65–99)
Glucose, 2 hour: 104 mg/dL (ref <140)

## 2015-11-27 ENCOUNTER — Encounter (HOSPITAL_COMMUNITY): Payer: Self-pay | Admitting: Family Medicine

## 2015-11-29 ENCOUNTER — Ambulatory Visit (INDEPENDENT_AMBULATORY_CARE_PROVIDER_SITE_OTHER): Payer: Managed Care, Other (non HMO) | Admitting: Physician Assistant

## 2015-11-29 ENCOUNTER — Encounter: Payer: Self-pay | Admitting: Physician Assistant

## 2015-11-29 VITALS — BP 112/80 | HR 96 | Temp 98.1°F | Resp 18 | Wt 268.0 lb

## 2015-11-29 DIAGNOSIS — H6091 Unspecified otitis externa, right ear: Secondary | ICD-10-CM | POA: Diagnosis not present

## 2015-11-29 DIAGNOSIS — O0001 Abdominal pregnancy with intrauterine pregnancy: Secondary | ICD-10-CM | POA: Diagnosis not present

## 2015-11-29 DIAGNOSIS — H6501 Acute serous otitis media, right ear: Secondary | ICD-10-CM | POA: Diagnosis not present

## 2015-11-29 MED ORDER — OFLOXACIN 0.3 % OT SOLN: 5.0000 [drp] | Freq: Two times a day (BID) | OTIC | 0 refills | Status: DC

## 2015-11-29 MED ORDER — AMOXICILLIN 875 MG PO TABS: 875.0000 mg | ORAL_TABLET | Freq: Two times a day (BID) | ORAL | 0 refills | Status: DC

## 2015-11-29 MED ORDER — NEOMYCIN-POLYMYXIN-HC 3.5-10000-1 OT SOLN: 3.0000 [drp] | Freq: Three times a day (TID) | OTIC | 0 refills | Status: DC

## 2015-11-29 NOTE — Progress Notes (Signed)
Patient ID: Kristine BrilliantMolly Hurst MRN: [id number], DOB: 02-12-1996, 20 y.o. Date of Encounter: 11/29/2015, 9:49 AM    Chief Complaint:  Chief Complaint  Patient presents with  . Otalgia    right ear pain     HPI: 20 y.o. year old female here with above.   She is pregnant. She says that her right ear has been hurting for 2 days now. No other complaints or concerns. Just her right ear hurts. Has had no significant nasal congestion or mucus from the nose. No cough. No fevers or chills.     Home Meds:   Outpatient Medications Prior to Visit  Medication Sig Dispense Refill  . doxylamine, Sleep, (UNISOM) 25 MG tablet Take 0.5-1 tablets (12.5-25 mg total) by mouth 2 (two) times daily as needed ([place]). 30 tablet 0  . ondansetron (ZOFRAN) 8 MG tablet Take 1 tablet (8 mg total) by mouth every 8 (eight) hours as needed for nausea or vomiting. 30 tablet 1  . Prenatal Vit-Fe Fumarate-FA (PRENATAL COMPLETE) 14-0.4 MG TABS Take 1 tablet by mouth daily. 90 each 4  . pyridOXINE (VITAMIN B-6) 50 MG tablet Take 0.5 tablets (25 mg total) by mouth 3 (three) times daily as needed (nausea). (Patient not taking: Reported on 11/29/2015) 21 tablet 0   No facility-administered medications prior to visit.     Allergies:  Allergies  Allergen Reactions  . Cefprozil     *CEFZIL*  . Cefprozil Other (See Comments)    Unknown reaction      Review of Systems: See HPI for pertinent ROS. All other ROS negative.    Physical Exam: Blood pressure 112/80, pulse 96, temperature 98.1 F (36.7 C), temperature source Oral, resp. rate 18, weight 268 lb (121.6 kg), last menstrual period 08/25/2015., Body mass index is 43.26 kg/m. General:  Obese WF. Appears in no acute distress. HEENT: Normocephalic, atraumatic, eyes without discharge, sclera non-icteric, nares are without discharge. Left ear canal clear, TM without perforation, pearly grey and translucent with reflective cone of light.  Right Ear: Canal with  erythema and edema and tenderness. TM not completely visualized secondary to swelling of canal. But TM appears intact but retracted and golden. Oral cavity moist, posterior pharynx without exudate, erythema, peritonsillar abscess.  Neck: Supple. No thyromegaly. No lymphadenopathy. Lungs: Clear bilaterally to auscultation without wheezes, rales, or rhonchi. Breathing is unlabored. Heart: Regular rhythm. No murmurs, rubs, or gallops. Msk:  Strength and tone normal for age. Extremities/Skin: Warm and dry. Neuro: Alert and oriented X 3. Moves all extremities spontaneously. Gait is normal. CNII-XII grossly in tact. Psych:  Responds to questions appropriately with a normal affect.     ASSESSMENT AND PLAN:  20 y.o. year old female with  1. Right acute serous otitis media, recurrence not specified Reviewed that patient does have allergy to Cefprozil. Discussed/Reviewed with her that she states that she can take amoxicillin and has not had any reaction to it. Also reviewed that she is pregnant and amoxicillin as category B for pregnancy so this should be safe. - amoxicillin (AMOXIL) 875 MG tablet; Take 1 tablet (875 mg total) by mouth 2 (two) times daily.  Dispense: 14 tablet; Refill: 0  2. Otitis externa, right Floxin Otic is category C for pregnancy. Reviewed other options for ear drops and they all are either a? For pregnancy or category C. Therefore opted to go with something that is category C rather than?. - ofloxacin (FLOXIN OTIC) 0.3 % otic solution; Place 5 drops into the right  ear 2 (two) times daily.  Dispense: 5 mL; Refill: 0  3. Abdominal pregnancy with intrauterine pregnancy  She is to take the amoxicillin as directed and use the eardrops as directed. Follow up if symptoms do not resolve in one week.   [address] [place], [place], [facility] 11/29/2015 9:49 AM

## 2015-12-05 ENCOUNTER — Ambulatory Visit (HOSPITAL_COMMUNITY)
Admission: RE | Admit: 2015-12-05 | Discharge: 2015-12-05 | Disposition: A | Discharge status: Home / Self Care | Payer: Managed Care, Other (non HMO) | Source: Ambulatory Visit | Attending: Family Medicine | Admitting: Family Medicine

## 2015-12-05 ENCOUNTER — Other Ambulatory Visit: Payer: Self-pay | Admitting: Family Medicine

## 2015-12-05 DIAGNOSIS — Z3402 Encounter for supervision of normal first pregnancy, second trimester: Secondary | ICD-10-CM

## 2015-12-05 DIAGNOSIS — E669 Obesity, unspecified: Secondary | ICD-10-CM

## 2015-12-05 DIAGNOSIS — O99212 Obesity complicating pregnancy, second trimester: Secondary | ICD-10-CM | POA: Insufficient documentation

## 2015-12-05 DIAGNOSIS — Z3A18 18 weeks gestation of pregnancy: Secondary | ICD-10-CM

## 2015-12-05 DIAGNOSIS — Z36 Encounter for antenatal screening of mother: Secondary | ICD-10-CM | POA: Insufficient documentation

## 2015-12-06 ENCOUNTER — Ambulatory Visit (INDEPENDENT_AMBULATORY_CARE_PROVIDER_SITE_OTHER): Payer: Managed Care, Other (non HMO) | Admitting: Obstetrics and Gynecology

## 2015-12-06 VITALS — BP 106/73 | HR 96 | Wt 274.0 lb

## 2015-12-06 DIAGNOSIS — O09899 Supervision of other high risk pregnancies, unspecified trimester: Secondary | ICD-10-CM

## 2015-12-06 DIAGNOSIS — O99212 Obesity complicating pregnancy, second trimester: Secondary | ICD-10-CM

## 2015-12-06 DIAGNOSIS — Z3402 Encounter for supervision of normal first pregnancy, second trimester: Secondary | ICD-10-CM

## 2015-12-06 DIAGNOSIS — Z283 Underimmunization status: Secondary | ICD-10-CM

## 2015-12-06 DIAGNOSIS — E669 Obesity, unspecified: Secondary | ICD-10-CM | POA: Diagnosis not present

## 2015-12-06 DIAGNOSIS — Z2839 Other underimmunization status: Secondary | ICD-10-CM

## 2015-12-06 DIAGNOSIS — O9989 Other specified diseases and conditions complicating pregnancy, childbirth and the puerperium: Secondary | ICD-10-CM

## 2015-12-06 LAB — POCT URINALYSIS DIPSTICK
Bilirubin, UA: NEGATIVE
GLUCOSE UA: NEGATIVE
KETONES UA: NEGATIVE
LEUKOCYTES UA: NEGATIVE
Nitrite, UA: NEGATIVE
Spec Grav, UA: 1.015
Urobilinogen, UA: NEGATIVE
pH, UA: 6.5

## 2015-12-06 MED ORDER — FLUCONAZOLE 150 MG PO TABS: 150.0000 mg | ORAL_TABLET | Freq: Once | ORAL | 0 refills | Status: AC

## 2015-12-06 NOTE — Progress Notes (Signed)
   PRENATAL VISIT NOTE  Subjective:  Kristine Hurst is a 20 y.o. G3P0020 at 6575w5d being seen today for ongoing prenatal care.  She is currently monitored for the following issues for this low-risk pregnancy and has Abnormal CT scan, chest; Supervision of normal first pregnancy, antepartum; Rubella non-immune status, antepartum; and Obesity affecting pregnancy, antepartum on her problem list.  Patient reports having a yeast infection. She reports a pruritic discharge without odor for the past few days. Contractions: Not present. Vag. Bleeding: None.  Movement: Increased. Denies leaking of fluid.   The following portions of the patient's history were reviewed and updated as appropriate: allergies, current medications, past family history, past medical history, past social history, past surgical history and problem list. Problem list updated.  Objective:   Vitals:   12/06/15 1014  BP: 106/73  Pulse: 96  Weight: 274 lb (124.3 kg)    Fetal Status:     Movement: Increased     General:  Alert, oriented and cooperative. Patient is in no acute distress.  Skin: Skin is warm and dry. No rash noted.   Cardiovascular: Normal heart rate noted  Respiratory: Normal respiratory effort, no problems with respiration noted  Abdomen: Soft, gravid, appropriate for gestational age. Pain/Pressure: Absent     Pelvic:  Cervical exam deferred        Extremities: Normal range of motion.     Mental Status: Normal mood and affect. Normal behavior. Normal judgment and thought content.   Urinalysis:      Assessment and Plan:  Pregnancy: G3P0020 at 4675w5d  1. Supervision of normal first pregnancy, antepartum, second trimester Patient is doing well with the exception of having a pruritic discharge. She declined pelvic exam with wet prep. Rx Diflucan provided. Advised patient to return before next appointment if symptoms persists for wet prep Patient declined flu vaccine Reviewed ultrasound results with the  patient who desires follow up to complete anatomy  2. Rubella non-immune status, antepartum Will offer postpartum vaccination  3. Obesity affecting pregnancy, antepartum, second trimester   General obstetric precautions including but not limited to vaginal bleeding, contractions, leaking of fluid and fetal movement were reviewed in detail with the patient. Please refer to After Visit Summary for other counseling recommendations.  Return in about 4 weeks (around 01/03/2016).  Catalina AntiguaPeggy Makyia Erxleben, MD

## 2015-12-06 NOTE — Addendum Note (Signed)
Addended by: Gita KudoLASSITER, Suhana Wilner S on: 12/06/2015 12:00 PM   Modules accepted: Orders

## 2015-12-09 LAB — CULTURE, OB URINE

## 2015-12-10 ENCOUNTER — Telehealth: Payer: Self-pay | Admitting: *Deleted

## 2015-12-10 MED ORDER — NITROFURANTOIN MONOHYD MACRO 100 MG PO CAPS: 100.0000 mg | ORAL_CAPSULE | Freq: Two times a day (BID) | ORAL | 0 refills | Status: DC

## 2015-12-10 NOTE — Telephone Encounter (Signed)
-----   Message from Catalina AntiguaPeggy Constant, MD sent at 12/10/2015  9:34 AM EDT ----- Please inform patient of positive UTI. Rx e-prescribed  Thanks  Kinder Morgan EnergyPeggy

## 2015-12-10 NOTE — Addendum Note (Signed)
Addended by: Catalina AntiguaONSTANT, Mayari Matus on: 12/10/2015 09:34 AM   Modules accepted: Orders

## 2015-12-10 NOTE — Telephone Encounter (Signed)
Called pt, no answer, left message about urine cx result and medication that was sent to the pharmacy.  Instructed to call the office with any questions or concerns.

## 2016-01-03 ENCOUNTER — Encounter: Payer: Managed Care, Other (non HMO) | Admitting: Obstetrics and Gynecology

## 2016-01-04 ENCOUNTER — Other Ambulatory Visit: Payer: Self-pay | Admitting: Obstetrics and Gynecology

## 2016-01-04 ENCOUNTER — Ambulatory Visit (HOSPITAL_COMMUNITY)
Admission: RE | Admit: 2016-01-04 | Discharge: 2016-01-04 | Disposition: A | Discharge status: Home / Self Care | Payer: Managed Care, Other (non HMO) | Source: Ambulatory Visit | Attending: Obstetrics and Gynecology | Admitting: Obstetrics and Gynecology

## 2016-01-04 DIAGNOSIS — Z3A22 22 weeks gestation of pregnancy: Secondary | ICD-10-CM | POA: Diagnosis not present

## 2016-01-04 DIAGNOSIS — O99212 Obesity complicating pregnancy, second trimester: Secondary | ICD-10-CM | POA: Diagnosis not present

## 2016-01-04 DIAGNOSIS — Z3402 Encounter for supervision of normal first pregnancy, second trimester: Secondary | ICD-10-CM

## 2016-01-04 DIAGNOSIS — Z362 Encounter for other antenatal screening follow-up: Secondary | ICD-10-CM | POA: Diagnosis not present

## 2016-01-10 ENCOUNTER — Ambulatory Visit (INDEPENDENT_AMBULATORY_CARE_PROVIDER_SITE_OTHER): Payer: Managed Care, Other (non HMO) | Admitting: Obstetrics and Gynecology

## 2016-01-10 VITALS — BP 108/74 | HR 80 | Wt 278.0 lb

## 2016-01-10 DIAGNOSIS — Z3400 Encounter for supervision of normal first pregnancy, unspecified trimester: Secondary | ICD-10-CM

## 2016-01-10 DIAGNOSIS — O2342 Unspecified infection of urinary tract in pregnancy, second trimester: Secondary | ICD-10-CM

## 2016-01-10 DIAGNOSIS — O2340 Unspecified infection of urinary tract in pregnancy, unspecified trimester: Secondary | ICD-10-CM | POA: Insufficient documentation

## 2016-01-10 MED ORDER — OMEPRAZOLE 20 MG PO CPDR: 20.0000 mg | DELAYED_RELEASE_CAPSULE | Freq: Every day | ORAL | 4 refills | Status: DC

## 2016-01-10 NOTE — Progress Notes (Signed)
Prenatal Visit Note Date: 01/10/2016 Clinic: [facility]  Subjective:  Kristine Hurst is a 20 y.o. G3P0020 at 6619w5d being seen today for ongoing prenatal care.  She is currently monitored for the following issues for this low-risk pregnancy and has Abnormal CT scan, chest; Supervision of normal first pregnancy, antepartum; Rubella non-immune status, antepartum; and Obesity affecting pregnancy, antepartum on her problem list.  Patient reports has occasional morning sickness (not as bad as before) and not needing to take the b6 and anti-histamine like earlier in pregnancy. Also occasional nose bleeds. Contractions: Not present. Vag. Bleeding: None.  Movement: Present. Denies leaking of fluid.   The following portions of the patient's history were reviewed and updated as appropriate: allergies, current medications, past family history, past medical history, past social history, past surgical history and problem list. Problem list updated.  Objective:   Vitals:   01/10/16 1036  BP: 108/74  Pulse: 80  Weight: 278 lb (126.1 kg)    Fetal Status: Fetal Heart Rate (bpm): 142   Movement: Present     General:  Alert, oriented and cooperative. Patient is in no acute distress.  Skin: Skin is warm and dry. No rash noted.   Cardiovascular: Normal heart rate noted  Respiratory: Normal respiratory effort, no problems with respiration noted  Abdomen: Soft, gravid, appropriate for gestational age. Pain/Pressure: Present     Pelvic:  Cervical exam deferred        Extremities: Normal range of motion.  Edema: None  Mental Status: Normal mood and affect. Normal behavior. Normal judgment and thought content.   Urinalysis:      Assessment and Plan:  Pregnancy: G3P0020 at 6919w5d  1. Supervision of normal first pregnancy, antepartum 28wk labs nv.  Routine care. Normal follow up anatomy scan Recommend humidifier and saline mist spray prn Pt able to stay hydrated. She was told that  okay to stay on the b6/anti-histamine for rest of pregnancy too. Told to also do PRN zantac as GERD can make nausea worse  2. Urinary tract infection in mother during second trimester of pregnancy toc today - Urine Culture  Preterm labor symptoms and general obstetric precautions including but not limited to vaginal bleeding, contractions, leaking of fluid and fetal movement were reviewed in detail with the patient. Please refer to After Visit Summary for other counseling recommendations.  Return in about 4 weeks (around 02/07/2016).   [place] Bingharlie Tibor Lemmons, MD

## 2016-01-10 NOTE — Addendum Note (Signed)
Addended by: [place] BingPICKENS, Caydence Enck on: 01/10/2016 10:58 AM   Modules accepted: Orders

## 2016-01-12 LAB — URINE CULTURE: Organism ID, Bacteria: NO GROWTH

## 2016-01-21 ENCOUNTER — Telehealth: Payer: Self-pay

## 2016-01-21 NOTE — Telephone Encounter (Signed)
MD has a meeting to attend, move pt appt time up or to another day, called pt no answer, left message for patient to call the office

## 2016-02-01 ENCOUNTER — Telehealth: Payer: Self-pay

## 2016-02-01 NOTE — Telephone Encounter (Signed)
Need to change appointment time on 02/07/2016. Called patient no answer, left message for patient to return my call at the office.

## 2016-02-07 ENCOUNTER — Encounter: Payer: Managed Care, Other (non HMO) | Admitting: Family Medicine

## 2016-03-04 ENCOUNTER — Ambulatory Visit (INDEPENDENT_AMBULATORY_CARE_PROVIDER_SITE_OTHER): Payer: Managed Care, Other (non HMO) | Admitting: Obstetrics and Gynecology

## 2016-03-04 ENCOUNTER — Encounter: Payer: Self-pay | Admitting: Obstetrics and Gynecology

## 2016-03-04 VITALS — BP 100/70 | HR 91 | Wt 300.0 lb

## 2016-03-04 DIAGNOSIS — O2343 Unspecified infection of urinary tract in pregnancy, third trimester: Secondary | ICD-10-CM

## 2016-03-04 DIAGNOSIS — O99210 Obesity complicating pregnancy, unspecified trimester: Secondary | ICD-10-CM

## 2016-03-04 DIAGNOSIS — Z3483 Encounter for supervision of other normal pregnancy, third trimester: Secondary | ICD-10-CM

## 2016-03-04 DIAGNOSIS — E669 Obesity, unspecified: Secondary | ICD-10-CM

## 2016-03-04 DIAGNOSIS — O99213 Obesity complicating pregnancy, third trimester: Secondary | ICD-10-CM

## 2016-03-04 DIAGNOSIS — Z3400 Encounter for supervision of normal first pregnancy, unspecified trimester: Secondary | ICD-10-CM

## 2016-03-04 LAB — CBC
HCT: 35.3 % (ref 35.0–45.0)
HEMOGLOBIN: 11.7 g/dL (ref 11.7–15.5)
MCH: 31.9 pg (ref 27.0–33.0)
MCHC: 33.1 g/dL (ref 32.0–36.0)
MCV: 96.2 fL (ref 80.0–100.0)
MPV: 10.2 fL (ref 7.5–12.5)
Platelets: 305 10*3/uL (ref 140–400)
RBC: 3.67 MIL/uL — AB (ref 3.80–5.10)
RDW: 13.2 % (ref 11.0–15.0)
WBC: 15.5 10*3/uL — ABNORMAL HIGH (ref 3.8–10.8)

## 2016-03-04 MED ORDER — PANTOPRAZOLE SODIUM 20 MG PO TBEC: DELAYED_RELEASE_TABLET | ORAL | 1 refills | Status: DC

## 2016-03-05 LAB — HIV ANTIBODY (ROUTINE TESTING W REFLEX): HIV 1&2 Ab, 4th Generation: NONREACTIVE

## 2016-03-05 LAB — GLUCOSE TOLERANCE, 1 HOUR (50G) W/O FASTING: Glucose, 1 Hr, gestational: 99 mg/dL

## 2016-03-05 LAB — RPR

## 2016-03-05 NOTE — Progress Notes (Signed)
Prenatal Visit Note Date: 03/04/2016 Clinic: [facility]  Subjective:  Kristine Hurst is a 20 y.o. G3P0020 at 6938w3d being seen today for ongoing prenatal care.  She is currently monitored for the following issues for this low-risk pregnancy and has Abnormal CT scan, chest; Supervision of normal first pregnancy, antepartum; Rubella non-immune status, antepartum; Obesity affecting pregnancy, antepartum; and UTI in pregnancy on her problem list.  Patient reports anxiety re: delivery.   Contractions: Not present.  .  Movement: Present. Denies leaking of fluid.   The following portions of the patient's history were reviewed and updated as appropriate: allergies, current medications, past family history, past medical history, past social history, past surgical history and problem list. Problem list updated.  Objective:   Vitals:   03/04/16 1448  BP: 100/70  Pulse: 91  Weight: 300 lb (136.1 kg)    Fetal Status: Fetal Heart Rate (bpm): 145 Fundal Height: 32 cm Movement: Present  Presentation: Vertex  General:  Alert, oriented and cooperative. Patient is in no acute distress.  Skin: Skin is warm and dry. No rash noted.   Cardiovascular: Normal heart rate noted  Respiratory: Normal respiratory effort, no problems with respiration noted  Abdomen: Soft, gravid, appropriate for gestational age. Pain/Pressure: Absent     Pelvic:  Cervical exam deferred        Extremities: Normal range of motion.  Edema: None  Mental Status: Normal mood and affect. Normal behavior. Normal judgment and thought content.   Urinalysis:      Assessment and Plan:  Pregnancy: G3P0020 at 8038w3d  1. Encounter for supervision of other normal pregnancy in third trimester 28wk labs today. Pt desired 1hr GTT. Long d/w pt and partner re: her anxiety. She states that her anxiety comes from not knowing when labor will come on and the potential pain and the fear that she may need a c-section. I told her  that her anxiety is valid but we wouldn't recommend trying for a VD if we thought she wouldn't be successful and statistically speaking she has no factors at this point that would require a c/s. Can d/w pt more at subsequent visits.  - CBC - RPR - HIV antibody (with reflex) - Glucose Tolerance, 1 HR (50g) w/o Fasting  2. Urinary tract infection in mother during third trimester of pregnancy Neg TOC  3. Obesity affecting pregnancy, antepartum No issues. Normal FH  Preterm labor symptoms and general obstetric precautions including but not limited to vaginal bleeding, contractions, leaking of fluid and fetal movement were reviewed in detail with the patient. Please refer to After Visit Summary for other counseling recommendations.  Return in about 2 weeks (around 03/18/2016) for rob.   Kristine Bingharlie Niaya Hickok, MD

## 2016-03-10 NOTE — L&D Delivery Note (Signed)
   Delivery Note At 8:12 PM a viable and healthy female was delivered via Vaginal, Spontaneous Delivery (Presentation: LOA ).  APGAR: 8, 9; weight 7 lb 0.5 oz (3189 g).   Placenta status: spontaneous ;intact.  Cord:  with the following complications: .    Anesthesia:  epidural Episiotomy: None Lacerations: Periurethral Suture Repair: 4.0 monocryl Est. Blood Loss (mL): 200  Mom to postpartum.  Baby to Couplet care / Skin to Skin.  [name] is a 21 y.o. female [phone] with IUP at 9464w6d admitted for ROM and early labor.  She progressed without augmentation to complete and pushed <30 minutes to deliver.  Cord clamping delayed by several minutes then clamped by CNM and cut by family member.  Placenta intact and spontaneous, bleeding minimal.  Periurethral laceration repaired without difficulty.  Mom and baby stable prior to transfer to postpartum. She plans on breastfeeding. She requests LARC for birth control.   Misty StanleyLisa Leftwich-Kirby 04/23/2016, 1:17 PM

## 2016-03-19 ENCOUNTER — Encounter: Payer: Self-pay | Admitting: *Deleted

## 2016-03-19 ENCOUNTER — Ambulatory Visit (INDEPENDENT_AMBULATORY_CARE_PROVIDER_SITE_OTHER): Payer: Medicaid Other | Admitting: Family Medicine

## 2016-03-19 VITALS — BP 96/69 | HR 88 | Wt 303.0 lb

## 2016-03-19 DIAGNOSIS — O9989 Other specified diseases and conditions complicating pregnancy, childbirth and the puerperium: Secondary | ICD-10-CM

## 2016-03-19 DIAGNOSIS — Z3483 Encounter for supervision of other normal pregnancy, third trimester: Secondary | ICD-10-CM

## 2016-03-19 DIAGNOSIS — Z3400 Encounter for supervision of normal first pregnancy, unspecified trimester: Secondary | ICD-10-CM

## 2016-03-19 DIAGNOSIS — O99210 Obesity complicating pregnancy, unspecified trimester: Secondary | ICD-10-CM

## 2016-03-19 DIAGNOSIS — E669 Obesity, unspecified: Secondary | ICD-10-CM

## 2016-03-19 DIAGNOSIS — Z23 Encounter for immunization: Secondary | ICD-10-CM | POA: Diagnosis not present

## 2016-03-19 DIAGNOSIS — Z2839 Other underimmunization status: Secondary | ICD-10-CM

## 2016-03-19 DIAGNOSIS — O2343 Unspecified infection of urinary tract in pregnancy, third trimester: Secondary | ICD-10-CM

## 2016-03-19 DIAGNOSIS — Z283 Underimmunization status: Secondary | ICD-10-CM

## 2016-03-19 DIAGNOSIS — O99213 Obesity complicating pregnancy, third trimester: Secondary | ICD-10-CM

## 2016-03-19 NOTE — Progress Notes (Signed)
   PRENATAL VISIT NOTE  Subjective:  Kristine Hurst is a 21 y.o. G3P0020 at 74w4dbeing seen today for ongoing prenatal care.  She is currently monitored for the following issues for this high-risk pregnancy and has Abnormal CT scan, chest; Supervision of normal first pregnancy, antepartum; Rubella non-immune status, antepartum; Obesity affecting pregnancy, antepartum; and UTI in pregnancy on her problem list.  Patient reports backache.  Contractions: Not present. Vag. Bleeding: None.  Movement: Present. Denies leaking of fluid.   The following portions of the patient's history were reviewed and updated as appropriate: allergies, current medications, past family history, past medical history, past social history, past surgical history and problem list. Problem list updated.  Objective:   Vitals:   03/19/16 1029  BP: 96/69  Pulse: 88  Weight: (!) 303 lb (137.4 kg)    Fetal Status: Fetal Heart Rate (bpm): 150   Movement: Present     General:  Alert, oriented and cooperative. Patient is in no acute distress.  Skin: Skin is warm and dry. No rash noted.   Cardiovascular: Normal heart rate noted  Respiratory: Normal respiratory effort, no problems with respiration noted  Abdomen: Soft, gravid, appropriate for gestational age. Pain/Pressure: Present     Pelvic:  Cervical exam deferred        Extremities: Normal range of motion.  Edema: None  Mental Status: Normal mood and affect. Normal behavior. Normal judgment and thought content.   Assessment and Plan:  Pregnancy: [id number] 340w4d1. Encounter for supervision of other normal pregnancy in third trimester - Tdap vaccine greater than or equal to 7yo IM  2. Urinary tract infection in mother during third trimester of pregnancy Neg TOC  3. Obesity affecting pregnancy, antepartum Inappropriate weight gain 32 lb (14.5 kg). Reviewed 1/2 lb per week weight gain and increased activity and healthy eating  4. Rubella non-immune status,  antepartum Needs MMR PP  5. Supervision of normal first pregnancy, antepartum Tdap today Reviewed contraception method- desires 3-5 years before next child. Gave info for websites that review contraceptive methods Discussed circ- desired by family. Reviewed cost and that insurance will not cover and she will need to pay out of pocket   Preterm labor symptoms and general obstetric precautions including but not limited to vaginal bleeding, contractions, leaking of fluid and fetal movement were reviewed in detail with the patient. Please refer to After Visit Summary for other counseling recommendations.  Return in about 2 weeks (around 04/02/2016) for Routine prenatal care.   KiCaren MacadamMD

## 2016-03-19 NOTE — Patient Instructions (Signed)
Places to have your son circumcised:    [facility] [phone] $480 by 4 wks  [facility] [phone] $244 by 4 wks  [facility] [phone] $175 by 2 wks  [facility] [phone] $250 by 7 days [facility] [phone] $150 by 4 wks  ([facility])  These prices sometimes change but are roughly what you can expect to pay. Please call and confirm pricing.   Circumcision is considered an elective/non-medically necessary procedure. There are many reasons parents decide to have their sons circumsized. During the first year of life circumcised males have a reduced risk of urinary tract infections but after this year the rates between circumcised males and uncircumcised males are the same.  It is safe to have your son circumcised outside of the hospital and the places above perform them regularly.    For contraception information [email or web address].[email or web address] [email or web address].[email or web address]   Breastfeeding Deciding to breastfeed is one of the best choices you can make for you and your baby. A change in hormones during pregnancy causes your breast tissue to grow and increases the number and size of your milk ducts. These hormones also allow proteins, sugars, and fats from your blood supply to make breast milk in your milk-producing glands. Hormones prevent breast milk from being released before your baby is born as well as prompt milk flow after birth. Once breastfeeding has begun, thoughts of your baby, as well as his or her sucking or crying, can stimulate the release of milk from your milk-producing glands. Benefits of breastfeeding For Your Baby  Your first milk (colostrum) helps your baby's digestive system function better.  There are antibodies in your milk that help your baby fight off infections.  Your baby has a lower incidence of asthma,  allergies, and sudden infant death syndrome.  The nutrients in breast milk are better for your baby than infant formulas and are designed uniquely for your baby's needs.  Breast milk improves your baby's brain development.  Your baby is less likely to develop other conditions, such as childhood obesity, asthma, or type 2 diabetes mellitus. For You  Breastfeeding helps to create a very special bond between you and your baby.  Breastfeeding is convenient. Breast milk is always available at the correct temperature and costs nothing.  Breastfeeding helps to burn calories and helps you lose the weight gained during pregnancy.  Breastfeeding makes your uterus contract to its prepregnancy size faster and slows bleeding (lochia) after you give birth.  Breastfeeding helps to lower your risk of developing type 2 diabetes mellitus, osteoporosis, and breast or ovarian cancer later in life. Signs that your baby is hungry Early Signs of Hunger  Increased alertness or activity.  Stretching.  Movement of the head from side to side.  Movement of the head and opening of the mouth when the corner of the mouth or cheek is stroked (rooting).  Increased sucking sounds, smacking lips, cooing, sighing, or squeaking.  Hand-to-mouth movements.  Increased sucking of fingers or hands. Late Signs of Hunger  Fussing.  Intermittent crying. Extreme Signs of Hunger  Signs of extreme hunger will require calming and consoling before your baby will be able to breastfeed successfully. Do not wait for the following signs of extreme hunger to occur before you initiate breastfeeding:  Restlessness.  A loud, strong cry.  Screaming. Breastfeeding basics  Breastfeeding Initiation  Find a comfortable place to sit or lie down, with your neck and back well supported.  Place a pillow or rolled up blanket under your baby to bring  him or her to the level of your breast (if you are seated). Nursing pillows are  specially designed to help support your arms and your baby while you breastfeed.  Make sure that your baby's abdomen is facing your abdomen.  Gently massage your breast. With your fingertips, massage from your chest wall toward your nipple in a circular motion. This encourages milk flow. You may need to continue this action during the feeding if your milk flows slowly.  Support your breast with 4 fingers underneath and your thumb above your nipple. Make sure your fingers are well away from your nipple and your baby's mouth.  Stroke your baby's lips gently with your finger or nipple.  When your baby's mouth is open wide enough, quickly bring your baby to your breast, placing your entire nipple and as much of the colored area around your nipple (areola) as possible into your baby's mouth.  More areola should be visible above your baby's upper lip than below the lower lip.  Your baby's tongue should be between his or her lower gum and your breast.  Ensure that your baby's mouth is correctly positioned around your nipple (latched). Your baby's lips should create a seal on your breast and be turned out (everted).  It is common for your baby to suck about 2-3 minutes in order to start the flow of breast milk. Latching  Teaching your baby how to latch on to your breast properly is very important. An improper latch can cause nipple pain and decreased milk supply for you and poor weight gain in your baby. Also, if your baby is not latched onto your nipple properly, he or she may swallow some air during feeding. This can make your baby fussy. Burping your baby when you switch breasts during the feeding can help to get rid of the air. However, teaching your baby to latch on properly is still the best way to prevent fussiness from swallowing air while breastfeeding. Signs that your baby has successfully latched on to your nipple:  Silent tugging or silent sucking, without causing you pain.  Swallowing  heard between every 3-4 sucks.  Muscle movement above and in front of his or her ears while sucking. Signs that your baby has not successfully latched on to nipple:  Sucking sounds or smacking sounds from your baby while breastfeeding.  Nipple pain. If you think your baby has not latched on correctly, slip your finger into the corner of your baby's mouth to break the suction and place it between your baby's gums. Attempt breastfeeding initiation again. Signs of Successful Breastfeeding  Signs from your baby:  A gradual decrease in the number of sucks or complete cessation of sucking.  Falling asleep.  Relaxation of his or her body.  Retention of a small amount of milk in his or her mouth.  Letting go of your breast by himself or herself. Signs from you:  Breasts that have increased in firmness, weight, and size 1-3 hours after feeding.  Breasts that are softer immediately after breastfeeding.  Increased milk volume, as well as a change in milk consistency and color by the fifth day of breastfeeding.  Nipples that are not sore, cracked, or bleeding. Signs That Your Pecola LeisureBaby is Getting Enough Milk  Wetting at least 1-2 diapers during the first 24 hours after birth.  Wetting at least 5-6 diapers every 24 hours for the first week after birth. The urine should be clear or pale yellow by 5 days after birth.  Wetting 6-8 diapers every 24 hours as your baby continues to grow and develop.  At least 3 stools in a 24-hour period by age 80 days. The stool should be soft and yellow.  At least 3 stools in a 24-hour period by age 80 days. The stool should be seedy and yellow.  No loss of weight greater than 10% of birth weight during the first 12 days of age.  Average weight gain of 4-7 ounces (113-198 g) per week after age 26 days.  Consistent daily weight gain by age 80 days, without weight loss after the age of 2 weeks. After a feeding, your baby may spit up a small amount. This is  common. Breastfeeding frequency and duration Frequent feeding will help you make more milk and can prevent sore nipples and breast engorgement. Breastfeed when you feel the need to reduce the fullness of your breasts or when your baby shows signs of hunger. This is called "breastfeeding on demand." Avoid introducing a pacifier to your baby while you are working to establish breastfeeding (the first 4-6 weeks after your baby is born). After this time you may choose to use a pacifier. Research has shown that pacifier use during the first year of a baby's life decreases the risk of sudden infant death syndrome (SIDS). Allow your baby to feed on each breast as long as he or she wants. Breastfeed until your baby is finished feeding. When your baby unlatches or falls asleep while feeding from the first breast, offer the second breast. Because newborns are often sleepy in the first few weeks of life, you may need to awaken your baby to get him or her to feed. Breastfeeding times will vary from baby to baby. However, the following rules can serve as a guide to help you ensure that your baby is properly fed:  Newborns (babies 71 weeks of age or younger) may breastfeed every 1-3 hours.  Newborns should not go longer than 3 hours during the day or 5 hours during the night without breastfeeding.  You should breastfeed your baby a minimum of 8 times in a 24-hour period until you begin to introduce solid foods to your baby at around 65 months of age. Breast milk pumping Pumping and storing breast milk allows you to ensure that your baby is exclusively fed your breast milk, even at times when you are unable to breastfeed. This is especially important if you are going back to work while you are still breastfeeding or when you are not able to be present during feedings. Your lactation consultant can give you guidelines on how long it is safe to store breast milk. A breast pump is a machine that allows you to pump milk  from your breast into a sterile bottle. The pumped breast milk can then be stored in a refrigerator or freezer. Some breast pumps are operated by hand, while others use electricity. Ask your lactation consultant which type will work best for you. Breast pumps can be purchased, but some hospitals and breastfeeding support groups lease breast pumps on a monthly basis. A lactation consultant can teach you how to hand express breast milk, if you prefer not to use a pump. Caring for your breasts while you breastfeed Nipples can become dry, cracked, and sore while breastfeeding. The following recommendations can help keep your breasts moisturized and healthy:  Avoid using soap on your nipples.  Wear a supportive bra. Although not required, special nursing bras and tank tops are designed to allow  access to your breasts for breastfeeding without taking off your entire bra or top. Avoid wearing underwire-style bras or extremely tight bras.  Air dry your nipples for 3-3minutes after each feeding.  Use only cotton bra pads to absorb leaked breast milk. Leaking of breast milk between feedings is normal.  Use lanolin on your nipples after breastfeeding. Lanolin helps to maintain your skin's normal moisture barrier. If you use pure lanolin, you do not need to wash it off before feeding your baby again. Pure lanolin is not toxic to your baby. You may also hand express a few drops of breast milk and gently massage that milk into your nipples and allow the milk to air dry. In the first few weeks after giving birth, some women experience extremely full breasts (engorgement). Engorgement can make your breasts feel heavy, warm, and tender to the touch. Engorgement peaks within 3-5 days after you give birth. The following recommendations can help ease engorgement:  Completely empty your breasts while breastfeeding or pumping. You may want to start by applying warm, moist heat (in the shower or with warm water-soaked hand  towels) just before feeding or pumping. This increases circulation and helps the milk flow. If your baby does not completely empty your breasts while breastfeeding, pump any extra milk after he or she is finished.  Wear a snug bra (nursing or regular) or tank top for 1-2 days to signal your body to slightly decrease milk production.  Apply ice packs to your breasts, unless this is too uncomfortable for you.  Make sure that your baby is latched on and positioned properly while breastfeeding. If engorgement persists after 48 hours of following these recommendations, contact your health care provider or a Advertising copywriter. Overall health care recommendations while breastfeeding  Eat healthy foods. Alternate between meals and snacks, eating 3 of each per day. Because what you eat affects your breast milk, some of the foods may make your baby more irritable than usual. Avoid eating these foods if you are sure that they are negatively affecting your baby.  Drink milk, fruit juice, and water to satisfy your thirst (about 10 glasses a day).  Rest often, relax, and continue to take your prenatal vitamins to prevent fatigue, stress, and anemia.  Continue breast self-awareness checks.  Avoid chewing and smoking tobacco. Chemicals from cigarettes that pass into breast milk and exposure to secondhand smoke may harm your baby.  Avoid alcohol and drug use, including marijuana. Some medicines that may be harmful to your baby can pass through breast milk. It is important to ask your health care provider before taking any medicine, including all over-the-counter and prescription medicine as well as vitamin and herbal supplements. It is possible to become pregnant while breastfeeding. If birth control is desired, ask your health care provider about options that will be safe for your baby. Contact a health care provider if:  You feel like you want to stop breastfeeding or have become frustrated with  breastfeeding.  You have painful breasts or nipples.  Your nipples are cracked or bleeding.  Your breasts are red, tender, or warm.  You have a swollen area on either breast.  You have a fever or chills.  You have nausea or vomiting.  You have drainage other than breast milk from your nipples.  Your breasts do not become full before feedings by the fifth day after you give birth.  You feel sad and depressed.  Your baby is too sleepy to eat well.  Your  baby is having trouble sleeping.  Your baby is wetting less than 3 diapers in a 24-hour period.  Your baby has less than 3 stools in a 24-hour period.  Your baby's skin or the white part of his or her eyes becomes yellow.  Your baby is not gaining weight by 59 days of age. Get help right away if:  Your baby is overly tired (lethargic) and does not want to wake up and feed.  Your baby develops an unexplained fever. This information is not intended to replace advice given to you by your health care provider. Make sure you discuss any questions you have with your health care provider. Document Released: 02/24/2005 Document Revised: 08/08/2015 Document Reviewed: 08/18/2012 Elsevier Interactive Patient Education  2017 [employer].

## 2016-04-04 ENCOUNTER — Ambulatory Visit (INDEPENDENT_AMBULATORY_CARE_PROVIDER_SITE_OTHER): Payer: Medicaid Other | Admitting: Obstetrics and Gynecology

## 2016-04-04 ENCOUNTER — Other Ambulatory Visit (HOSPITAL_COMMUNITY)
Admission: RE | Admit: 2016-04-04 | Discharge: 2016-04-04 | Disposition: A | Discharge status: Home / Self Care | Payer: Medicaid Other | Source: Ambulatory Visit | Attending: Obstetrics and Gynecology | Admitting: Obstetrics and Gynecology

## 2016-04-04 VITALS — BP 114/79 | HR 94 | Wt 308.0 lb

## 2016-04-04 DIAGNOSIS — O99210 Obesity complicating pregnancy, unspecified trimester: Secondary | ICD-10-CM

## 2016-04-04 DIAGNOSIS — Z113 Encounter for screening for infections with a predominantly sexual mode of transmission: Secondary | ICD-10-CM | POA: Diagnosis present

## 2016-04-04 DIAGNOSIS — Z3483 Encounter for supervision of other normal pregnancy, third trimester: Secondary | ICD-10-CM

## 2016-04-04 DIAGNOSIS — O99213 Obesity complicating pregnancy, third trimester: Secondary | ICD-10-CM

## 2016-04-04 DIAGNOSIS — Z6841 Body Mass Index (BMI) 40.0 and over, adult: Secondary | ICD-10-CM | POA: Insufficient documentation

## 2016-04-04 DIAGNOSIS — O0993 Supervision of high risk pregnancy, unspecified, third trimester: Secondary | ICD-10-CM

## 2016-04-04 LAB — OB RESULTS CONSOLE GBS: STREP GROUP B AG: NEGATIVE

## 2016-04-04 NOTE — Progress Notes (Signed)
Prenatal Visit Note Date: 04/04/2016 Clinic: [facility]  Subjective:  Kristine Hurst is a 21 y.o. G3P0020 at 7655w6d being seen today for ongoing prenatal care.  She is currently monitored for the following issues for this high risk pregnancy and has Abnormal CT scan, chest; Supervision of normal first pregnancy, antepartum; Rubella non-immune status, antepartum; Obesity affecting pregnancy, antepartum; UTI in pregnancy; and BMI 45.0-49.9, adult (HCC) on her problem list.  Patient reports no complaints.   Contractions: Not present. Vag. Bleeding: None.  Movement: Present. Denies leaking of fluid.   The following portions of the patient's history were reviewed and updated as appropriate: allergies, current medications, past family history, past medical history, past social history, past surgical history and problem list. Problem list updated.  Objective:   Vitals:   04/04/16 0949  BP: 114/79  Pulse: 94  Weight: (!) 308 lb (139.7 kg)    Fetal Status: Fetal Heart Rate (bpm): 144 Fundal Height: 36 cm Movement: Present  Presentation: Vertex  General:  Alert, oriented and cooperative. Patient is in no acute distress.  Skin: Skin is warm and dry. No rash noted.   Cardiovascular: Normal heart rate noted  Respiratory: Normal respiratory effort, no problems with respiration noted  Abdomen: Soft, gravid, appropriate for gestational age. Pain/Pressure: Present     Pelvic:  Cervical exam deferred        Extremities: Normal range of motion.  Edema: None  Mental Status: Normal mood and affect. Normal behavior. Normal judgment and thought content.   Urinalysis:      Assessment and Plan:  Pregnancy: G3P0020 at 5055w6d  1. Encounter for supervision of high risk pregnancy in third trimester Routine care - Cervicovaginal ancillary only - Strep Gp B Culture+Rflx  2. Obesity affecting pregnancy, antepartum No change in plan of care   Preterm labor symptoms and general  obstetric precautions including but not limited to vaginal bleeding, contractions, leaking of fluid and fetal movement were reviewed in detail with the patient. Please refer to After Visit Summary for other counseling recommendations.  Return in about 1 week (around 04/11/2016) for 7-10d rob.   Brenham Bingharlie Amada Hallisey, MD

## 2016-04-04 NOTE — Progress Notes (Signed)
Pt states she had one episode of light spotting a few days after intercourse, has resolved.

## 2016-04-08 LAB — STREP GP B CULTURE+RFLX: Strep Gp B Culture+Rflx: NEGATIVE

## 2016-04-08 LAB — CERVICOVAGINAL ANCILLARY ONLY
Chlamydia: NEGATIVE
NEISSERIA GONORRHEA: NEGATIVE

## 2016-04-13 ENCOUNTER — Encounter (HOSPITAL_COMMUNITY): Payer: Self-pay | Admitting: *Deleted

## 2016-04-13 ENCOUNTER — Emergency Department (HOSPITAL_COMMUNITY)
Admission: EM | Admit: 2016-04-13 | Discharge: 2016-04-13 | Disposition: A | Discharge status: Home / Self Care | Payer: Medicaid Other | Attending: Emergency Medicine | Admitting: Emergency Medicine

## 2016-04-13 DIAGNOSIS — O26893 Other specified pregnancy related conditions, third trimester: Secondary | ICD-10-CM | POA: Diagnosis present

## 2016-04-13 DIAGNOSIS — Z3A37 37 weeks gestation of pregnancy: Secondary | ICD-10-CM

## 2016-04-13 DIAGNOSIS — R102 Pelvic and perineal pain: Secondary | ICD-10-CM | POA: Diagnosis not present

## 2016-04-13 DIAGNOSIS — R109 Unspecified abdominal pain: Secondary | ICD-10-CM

## 2016-04-13 DIAGNOSIS — O471 False labor at or after 37 completed weeks of gestation: Secondary | ICD-10-CM | POA: Diagnosis not present

## 2016-04-13 DIAGNOSIS — O479 False labor, unspecified: Secondary | ICD-10-CM

## 2016-04-13 DIAGNOSIS — Z87891 Personal history of nicotine dependence: Secondary | ICD-10-CM | POA: Diagnosis not present

## 2016-04-13 LAB — CBC WITH DIFFERENTIAL/PLATELET
Basophils Absolute: 0 10*3/uL (ref 0.0–0.1)
Basophils Relative: 0 %
Eosinophils Absolute: 0.1 10*3/uL (ref 0.0–0.7)
Eosinophils Relative: 1 %
HCT: 39 % (ref 36.0–46.0)
Hemoglobin: 13.3 g/dL (ref 12.0–15.0)
Lymphocytes Relative: 15 %
Lymphs Abs: 2.3 10*3/uL (ref 0.7–4.0)
MCH: 31.5 pg (ref 26.0–34.0)
MCHC: 34.1 g/dL (ref 30.0–36.0)
MCV: 92.4 fL (ref 78.0–100.0)
Monocytes Absolute: 1 10*3/uL (ref 0.1–1.0)
Monocytes Relative: 7 %
Neutro Abs: 11.7 10*3/uL — ABNORMAL HIGH (ref 1.7–7.7)
Neutrophils Relative %: 77 %
Platelets: 279 10*3/uL (ref 150–400)
RBC: 4.22 MIL/uL (ref 3.87–5.11)
RDW: 13.6 % (ref 11.5–15.5)
WBC: 15.2 10*3/uL — ABNORMAL HIGH (ref 4.0–10.5)

## 2016-04-13 LAB — BASIC METABOLIC PANEL
ANION GAP: 12 (ref 5–15)
BUN: 11 mg/dL (ref 6–20)
CALCIUM: 8.9 mg/dL (ref 8.9–10.3)
CO2: 19 mmol/L — ABNORMAL LOW (ref 22–32)
Chloride: 105 mmol/L (ref 101–111)
Creatinine, Ser: 0.73 mg/dL (ref 0.44–1.00)
Glucose, Bld: 105 mg/dL — ABNORMAL HIGH (ref 65–99)
Potassium: 3.9 mmol/L (ref 3.5–5.1)
SODIUM: 136 mmol/L (ref 135–145)

## 2016-04-13 LAB — URINALYSIS, ROUTINE W REFLEX MICROSCOPIC
Bilirubin Urine: NEGATIVE
Glucose, UA: NEGATIVE mg/dL
Hgb urine dipstick: NEGATIVE
Ketones, ur: NEGATIVE mg/dL
Nitrite: NEGATIVE
Protein, ur: NEGATIVE mg/dL
Specific Gravity, Urine: 1.029 (ref 1.005–1.030)
pH: 6 (ref 5.0–8.0)

## 2016-04-13 MED ORDER — SODIUM CHLORIDE 0.9 % IV BOLUS (SEPSIS)
1000.0000 mL | Freq: Once | INTRAVENOUS | Status: AC
  Administered 2016-04-13: 1000 mL via INTRAVENOUS

## 2016-04-13 NOTE — Progress Notes (Signed)
Spoke with Dr. Despina HiddenEure. [facility] at [facility] 1[facility]redacted] weeks gestation here with c/o lower back pain and diarreaha . Last stool Loretha Ure this morning. Pt's FHR baseline initially 165,now  150 BPM, min- mod variability, 10x10, 15x15 accels. 2 ? Late decels Pt positioned more on her rt side. UC's every 3-5 min. Cervix is 1cm dilated, 70% effaced, vertex at a -2 station. Pt is getting a liter of NS. Pt is OB cleared and may be dc'd home.

## 2016-04-13 NOTE — Progress Notes (Signed)
Pt is a G3P0 at 37 1/[redacted] weeks gestation here with c/o back pain and diarreaha since yesterday. No fever. Says last  Stool was Kristine Hurst this morning. Says she has been eating and drinking. Pt says [facility]. No vaginal bleeding or leaking of fluid Denies any problems with this pregnancy.

## 2016-04-13 NOTE — ED Triage Notes (Signed)
Pt reports this is her first pregnancy, is approx 37 weeks. Pt having pelvic pain, pressure and back pain since last night. Back pain is constant, pelvic pain is intermittent. Has vaginal discharge, no bleeding. Reports good fetal movement.

## 2016-04-13 NOTE — ED Notes (Signed)
Pt having back pain consistently, and abd pain intermittently, FHT 150-170 left mid abd.  OB Rapid Response nurse here.

## 2016-04-13 NOTE — Progress Notes (Signed)
Dr. Despina HiddenEure is doing a C/S. Circulating nurse will have him call me when he is done.

## 2016-04-13 NOTE — ED Provider Notes (Signed)
[facility]-EMERGENCY DEPT Provider Note   CSN: [id number] Arrival date & time: 04/13/16  1252     History   Chief Complaint Chief Complaint  Patient presents with  . Abdominal Pain    HPI Kristine Hurst is a 21 y.o. female.  Pt is a 21 year old, 33 week G3P0020 woman who presents to the ED today with pelvic pain and pressure and back pain.  Sx have been going on since last night.  Pt has been feeling the baby move.  She does c/o vaginal d/c.  No bleeding.  No water gush.    Pt sees Dr. Vergie Living in [place] for her OB care.         Past Medical History:  Diagnosis Date  . Seasonal allergies     Patient Active Problem List   Diagnosis Date Noted  . BMI 45.0-49.9, adult (HCC) 04/04/2016  . UTI in pregnancy 01/10/2016  . Supervision of high-risk pregnancy 11/08/2015  . Rubella non-immune status, antepartum 11/08/2015  . Obesity affecting pregnancy, antepartum 11/08/2015  . Abnormal CT scan, chest 05/17/2012    Past Surgical History:  Procedure Laterality Date  . INDUCED ABORTION  2013    OB History    Gravida Para Term Preterm AB Living   3 0 0 0 2 0   SAB TAB Ectopic Multiple Live Births   1 1 0 0 0       Home Medications    Prior to Admission medications   Medication Sig Start Date End Date Taking? Authorizing Provider  acetaminophen (TYLENOL) 325 MG tablet Take 650-975 mg by mouth daily as needed for headache.   Yes Historical Provider, MD  pantoprazole (PROTONIX) 20 MG tablet 20-40mg  po qday for GERD Patient taking differently: Take 20 mg by mouth daily as needed for heartburn or indigestion (GERD).  03/04/16  Yes Fieldon Bing, MD  Prenatal Vit-Fe Fumarate-FA (PRENATAL COMPLETE) 14-0.4 MG TABS Take 1 tablet by mouth daily. 09/09/15  Yes Kristen N Ward, DO    Family History Family History  Problem Relation Age of Onset  . Diabetes Other     Great gradparents paternal  . Diabetes Other     Great gradparents maternal  . Hypertension Maternal Grandmother     . Diabetes Maternal Grandmother   . Hypertension Maternal Grandfather   . Allergies Maternal Grandfather   . Heart attack Maternal Grandfather   . Sleep apnea Maternal Grandfather   . Allergies Brother   . Heart disease Other     great maternal grandparent  . Lung cancer Other     great maternal grandparent  . Pancreatic cancer Other     great maternal grandparent  . Melanoma Other     great maternal grandparent  . Asthma Mother   . Emphysema Other     great grandfather    Social History Social History  Substance Use Topics  . Smoking status: Former Smoker    Packs/day: 0.00    Years: 3.00    Types: Cigarettes  . Smokeless tobacco: Never Used     Comment: Pt states she is down to 3-4 cigarrettes a day  . Alcohol use No     Allergies   Cefprozil   Review of Systems Review of Systems  Gastrointestinal: Positive for abdominal pain and nausea.  All other systems reviewed and are negative.    Physical Exam Updated Vital Signs BP 112/66 (BP Location: Left Arm)   Pulse 93   Temp 98 F (36.7 C) (Oral)  Resp 23   LMP 08/25/2015 (Approximate)   SpO2 96%   Physical Exam  Constitutional: She is oriented to person, place, and time. She appears well-developed and well-nourished.  HENT:  Head: Normocephalic and atraumatic.  Right Ear: External ear normal.  Left Ear: External ear normal.  Nose: Nose normal.  Mouth/Throat: Oropharynx is clear and moist.  Eyes: Conjunctivae and EOM are normal. Pupils are equal, round, and reactive to light.  Neck: Normal range of motion. Neck supple.  Cardiovascular: Normal rate, regular rhythm, normal heart sounds and intact distal pulses.   Pulmonary/Chest: Effort normal and breath sounds normal.  Abdominal: Soft. Bowel sounds are normal.  Pt is obese and belly is gravid  Genitourinary:  Genitourinary Comments: Cervix checked with a sterile glove.  1 cm dilated  Musculoskeletal: Normal range of motion.  Neurological: She is  alert and oriented to person, place, and time.  Skin: Skin is warm. Capillary refill takes less than 2 seconds.  Psychiatric: She has a normal mood and affect. Her behavior is normal. Judgment and thought content normal.  Nursing note and vitals reviewed.    ED Treatments / Results  Labs (all labs ordered are listed, but only abnormal results are displayed) Labs Reviewed  CBC WITH DIFFERENTIAL/PLATELET - Abnormal; Notable for the following:       Result Value   WBC 15.2 (*)    Neutro Abs 11.7 (*)    All other components within normal limits  BASIC METABOLIC PANEL - Abnormal; Notable for the following:    CO2 19 (*)    Glucose, Bld 105 (*)    All other components within normal limits  URINALYSIS, ROUTINE W REFLEX MICROSCOPIC - Abnormal; Notable for the following:    APPearance HAZY (*)    Leukocytes, UA MODERATE (*)    Bacteria, UA RARE (*)    Squamous Epithelial / LPF 0-5 (*)    All other components within normal limits    EKG  EKG Interpretation None       Radiology No results found.  Procedures Procedures (including critical care time)  Medications Ordered in ED Medications  sodium chloride 0.9 % bolus 1,000 mL (0 mLs Intravenous Stopped 04/13/16 1548)     Initial Impression / Assessment and Plan / ED Course  I have reviewed the triage vital signs and the nursing notes.  Pertinent labs & imaging results that were available during my care of the patient were reviewed by me and considered in my medical decision making (see chart for details).    Rapid OB response called upon pt's arrival.  Pt is not in labor.  Pt encouraged to return if pain worsens.  She knows to call her obgyn tomorrow.  Final Clinical Impressions(s) / ED Diagnoses   Final diagnoses:  [redacted] weeks gestation of pregnancy  Braxton Hick's contraction  Abdominal pain during pregnancy in third trimester    New Prescriptions New Prescriptions   No medications on file     Jacalyn LefevreJulie Dmitriy Gair,  MD 04/13/16 1620

## 2016-04-14 LAB — URINE CULTURE

## 2016-04-16 ENCOUNTER — Ambulatory Visit (INDEPENDENT_AMBULATORY_CARE_PROVIDER_SITE_OTHER): Payer: Medicaid Other | Admitting: Obstetrics and Gynecology

## 2016-04-16 VITALS — BP 119/82 | HR 103 | Wt 317.0 lb

## 2016-04-16 DIAGNOSIS — Z2839 Other underimmunization status: Secondary | ICD-10-CM

## 2016-04-16 DIAGNOSIS — O9989 Other specified diseases and conditions complicating pregnancy, childbirth and the puerperium: Secondary | ICD-10-CM

## 2016-04-16 DIAGNOSIS — O0993 Supervision of high risk pregnancy, unspecified, third trimester: Secondary | ICD-10-CM

## 2016-04-16 DIAGNOSIS — Z283 Underimmunization status: Secondary | ICD-10-CM

## 2016-04-16 MED ORDER — PROMETHAZINE HCL 25 MG PO TABS: 25.0000 mg | ORAL_TABLET | Freq: Four times a day (QID) | ORAL | 1 refills | Status: DC | PRN

## 2016-04-16 NOTE — Progress Notes (Signed)
   PRENATAL VISIT NOTE  Subjective:  Ulice BrilliantMolly Hust is a 21 y.o. G3P0020 at 1156w4d being seen today for ongoing prenatal care.  She is currently monitored for the following issues for this high-risk pregnancy and has Abnormal CT scan, chest; Supervision of high-risk pregnancy; Rubella non-immune status, antepartum; Obesity affecting pregnancy, antepartum; UTI in pregnancy; and BMI 45.0-49.9, adult (HCC) on her problem list.  Patient reports some nausea and emesis in the morning.  Contractions: Not present. Vag. Bleeding: None.  Movement: Present. Denies leaking of fluid.   The following portions of the patient's history were reviewed and updated as appropriate: allergies, current medications, past family history, past medical history, past social history, past surgical history and problem list. Problem list updated.  Objective:   Vitals:   04/16/16 1042  BP: 119/82  Pulse: (!) 103  Weight: (!) 317 lb (143.8 kg)    Fetal Status: Fetal Heart Rate (bpm): 140 Fundal Height: 38 cm Movement: Present     General:  Alert, oriented and cooperative. Patient is in no acute distress.  Skin: Skin is warm and dry. No rash noted.   Cardiovascular: Normal heart rate noted  Respiratory: Normal respiratory effort, no problems with respiration noted  Abdomen: Soft, gravid, appropriate for gestational age. Pain/Pressure: Present     Pelvic:  Cervical exam deferred        Extremities: Normal range of motion.  Edema: None  Mental Status: Normal mood and affect. Normal behavior. Normal judgment and thought content.   Assessment and Plan:  Pregnancy: G3P0020 at 6256w4d  1. Supervision of high risk pregnancy in third trimester Patient is doing well Rx Phenergan provided Patient desires all female staff at the time of delivery. Discussed patient that we cannot guarantee that but we can try to arrange it female staff is available at the time of her delivery  2. Rubella non-immune status, antepartum Will  offer pp  Term labor symptoms and general obstetric precautions including but not limited to vaginal bleeding, contractions, leaking of fluid and fetal movement were reviewed in detail with the patient. Please refer to After Visit Summary for other counseling recommendations.  Return in about 1 week (around 04/23/2016).   Catalina AntiguaPeggy Tuere Nwosu, MD

## 2016-04-18 ENCOUNTER — Encounter (HOSPITAL_COMMUNITY): Payer: Self-pay | Admitting: *Deleted

## 2016-04-18 ENCOUNTER — Inpatient Hospital Stay (HOSPITAL_COMMUNITY): Payer: Medicaid Other | Admitting: Anesthesiology

## 2016-04-18 ENCOUNTER — Inpatient Hospital Stay (HOSPITAL_COMMUNITY)
Admission: AD | Admit: 2016-04-18 | Discharge: 2016-04-20 | DRG: 775 | Disposition: A | Discharge status: Home / Self Care | Payer: Medicaid Other | Source: Ambulatory Visit | Attending: Obstetrics and Gynecology | Admitting: Obstetrics and Gynecology

## 2016-04-18 DIAGNOSIS — Z87891 Personal history of nicotine dependence: Secondary | ICD-10-CM

## 2016-04-18 DIAGNOSIS — Z3A37 37 weeks gestation of pregnancy: Secondary | ICD-10-CM

## 2016-04-18 DIAGNOSIS — K219 Gastro-esophageal reflux disease without esophagitis: Secondary | ICD-10-CM | POA: Diagnosis present

## 2016-04-18 DIAGNOSIS — O4202 Full-term premature rupture of membranes, onset of labor within 24 hours of rupture: Secondary | ICD-10-CM

## 2016-04-18 DIAGNOSIS — Z833 Family history of diabetes mellitus: Secondary | ICD-10-CM | POA: Diagnosis not present

## 2016-04-18 DIAGNOSIS — O99344 Other mental disorders complicating childbirth: Secondary | ICD-10-CM | POA: Diagnosis present

## 2016-04-18 DIAGNOSIS — Z8249 Family history of ischemic heart disease and other diseases of the circulatory system: Secondary | ICD-10-CM | POA: Diagnosis not present

## 2016-04-18 DIAGNOSIS — F419 Anxiety disorder, unspecified: Secondary | ICD-10-CM | POA: Diagnosis present

## 2016-04-18 DIAGNOSIS — O99214 Obesity complicating childbirth: Secondary | ICD-10-CM | POA: Diagnosis present

## 2016-04-18 DIAGNOSIS — Z6841 Body Mass Index (BMI) 40.0 and over, adult: Secondary | ICD-10-CM

## 2016-04-18 DIAGNOSIS — O9962 Diseases of the digestive system complicating childbirth: Secondary | ICD-10-CM | POA: Diagnosis present

## 2016-04-18 HISTORY — DX: Other specified health status: Z78.9

## 2016-04-18 LAB — CBC
HEMATOCRIT: 36.6 % (ref 36.0–46.0)
HEMOGLOBIN: 12.9 g/dL (ref 12.0–15.0)
MCH: 32 pg (ref 26.0–34.0)
MCHC: 35.2 g/dL (ref 30.0–36.0)
MCV: 90.8 fL (ref 78.0–100.0)
Platelets: 273 10*3/uL (ref 150–400)
RBC: 4.03 MIL/uL (ref 3.87–5.11)
RDW: 13.8 % (ref 11.5–15.5)
WBC: 12.3 10*3/uL — ABNORMAL HIGH (ref 4.0–10.5)

## 2016-04-18 LAB — ABO/RH: ABO/RH(D): O POS

## 2016-04-18 LAB — TYPE AND SCREEN
ABO/RH(D): O POS
Antibody Screen: NEGATIVE

## 2016-04-18 MED ORDER — OXYTOCIN BOLUS FROM INFUSION
500.0000 mL | Freq: Once | INTRAVENOUS | Status: AC
  Administered 2016-04-18: 500 mL via INTRAVENOUS

## 2016-04-18 MED ORDER — LACTATED RINGERS IV SOLN: 500.0000 mL | INTRAVENOUS | Status: DC | PRN

## 2016-04-18 MED ORDER — IBUPROFEN 600 MG PO TABS
600.0000 mg | ORAL_TABLET | Freq: Four times a day (QID) | ORAL | Status: DC
  Administered 2016-04-19 – 2016-04-20 (×7): 600 mg via ORAL
  Filled 2016-04-18 (×7): qty 1

## 2016-04-18 MED ORDER — DIPHENHYDRAMINE HCL 25 MG PO CAPS: 25.0000 mg | ORAL_CAPSULE | Freq: Four times a day (QID) | ORAL | Status: DC | PRN

## 2016-04-18 MED ORDER — PRENATAL MULTIVITAMIN CH
1.0000 | ORAL_TABLET | Freq: Every day | ORAL | Status: DC
  Administered 2016-04-19 – 2016-04-20 (×2): 1 via ORAL
  Filled 2016-04-18 (×2): qty 1

## 2016-04-18 MED ORDER — OXYTOCIN 40 UNITS IN LACTATED RINGERS INFUSION - SIMPLE MED
2.5000 [IU]/h | INTRAVENOUS | Status: DC
  Filled 2016-04-18: qty 1000

## 2016-04-18 MED ORDER — ACETAMINOPHEN 325 MG PO TABS: 650.0000 mg | ORAL_TABLET | ORAL | Status: DC | PRN

## 2016-04-18 MED ORDER — OXYCODONE HCL 5 MG PO TABS
5.0000 mg | ORAL_TABLET | ORAL | Status: DC | PRN
  Administered 2016-04-19 – 2016-04-20 (×7): 5 mg via ORAL
  Filled 2016-04-18 (×7): qty 1

## 2016-04-18 MED ORDER — COCONUT OIL OIL: 1.0000 "application " | TOPICAL_OIL | Status: DC | PRN

## 2016-04-18 MED ORDER — LACTATED RINGERS IV SOLN
500.0000 mL | Freq: Once | INTRAVENOUS | Status: AC
  Administered 2016-04-18: 1000 mL via INTRAVENOUS

## 2016-04-18 MED ORDER — ONDANSETRON HCL 4 MG/2ML IJ SOLN: 4.0000 mg | INTRAMUSCULAR | Status: DC | PRN

## 2016-04-18 MED ORDER — DIPHENHYDRAMINE HCL 50 MG/ML IJ SOLN: 12.5000 mg | INTRAMUSCULAR | Status: DC | PRN

## 2016-04-18 MED ORDER — FLEET ENEMA 7-19 GM/118ML RE ENEM: 1.0000 | ENEMA | RECTAL | Status: DC | PRN

## 2016-04-18 MED ORDER — SIMETHICONE 80 MG PO CHEW: 80.0000 mg | CHEWABLE_TABLET | ORAL | Status: DC | PRN

## 2016-04-18 MED ORDER — EPHEDRINE 5 MG/ML INJ
10.0000 mg | INTRAVENOUS | Status: DC | PRN
  Filled 2016-04-18: qty 4

## 2016-04-18 MED ORDER — WITCH HAZEL-GLYCERIN EX PADS: 1.0000 "application " | MEDICATED_PAD | CUTANEOUS | Status: DC | PRN

## 2016-04-18 MED ORDER — SOD CITRATE-CITRIC ACID 500-334 MG/5ML PO SOLN
30.0000 mL | ORAL | Status: DC | PRN
  Administered 2016-04-18: 30 mL via ORAL
  Filled 2016-04-18: qty 15

## 2016-04-18 MED ORDER — ACETAMINOPHEN 325 MG PO TABS
650.0000 mg | ORAL_TABLET | ORAL | Status: DC | PRN
  Administered 2016-04-19 – 2016-04-20 (×4): 650 mg via ORAL
  Filled 2016-04-18 (×4): qty 2

## 2016-04-18 MED ORDER — MEASLES, MUMPS & RUBELLA VAC ~~LOC~~ INJ
0.5000 mL | INJECTION | Freq: Once | SUBCUTANEOUS | Status: AC
  Administered 2016-04-20: 0.5 mL via SUBCUTANEOUS
  Filled 2016-04-18 (×2): qty 0.5

## 2016-04-18 MED ORDER — OXYCODONE-ACETAMINOPHEN 5-325 MG PO TABS: 1.0000 | ORAL_TABLET | ORAL | Status: DC | PRN

## 2016-04-18 MED ORDER — ONDANSETRON HCL 4 MG PO TABS
4.0000 mg | ORAL_TABLET | ORAL | Status: DC | PRN
Start: 2016-04-18 — End: 2016-04-20

## 2016-04-18 MED ORDER — PHENYLEPHRINE 40 MCG/ML (10ML) SYRINGE FOR IV PUSH (FOR BLOOD PRESSURE SUPPORT)
80.0000 ug | PREFILLED_SYRINGE | INTRAVENOUS | Status: DC | PRN
  Filled 2016-04-18: qty 5

## 2016-04-18 MED ORDER — FENTANYL 2.5 MCG/ML BUPIVACAINE 1/10 % EPIDURAL INFUSION (WH - ANES)
14.0000 mL/h | INTRAMUSCULAR | Status: DC | PRN
  Administered 2016-04-18 (×2): 14 mL/h via EPIDURAL
  Filled 2016-04-18 (×2): qty 100

## 2016-04-18 MED ORDER — FENTANYL CITRATE (PF) 100 MCG/2ML IJ SOLN: 50.0000 ug | INTRAMUSCULAR | Status: DC | PRN

## 2016-04-18 MED ORDER — ZOLPIDEM TARTRATE 5 MG PO TABS
5.0000 mg | ORAL_TABLET | Freq: Every evening | ORAL | Status: DC | PRN
Start: 2016-04-18 — End: 2016-04-20

## 2016-04-18 MED ORDER — LACTATED RINGERS IV SOLN
INTRAVENOUS | Status: DC
  Administered 2016-04-18 (×2): via INTRAVENOUS

## 2016-04-18 MED ORDER — OXYCODONE-ACETAMINOPHEN 5-325 MG PO TABS: 2.0000 | ORAL_TABLET | ORAL | Status: DC | PRN

## 2016-04-18 MED ORDER — BENZOCAINE-MENTHOL 20-0.5 % EX AERO
1.0000 "application " | INHALATION_SPRAY | CUTANEOUS | Status: DC | PRN
  Filled 2016-04-18: qty 56

## 2016-04-18 MED ORDER — SENNOSIDES-DOCUSATE SODIUM 8.6-50 MG PO TABS
2.0000 | ORAL_TABLET | ORAL | Status: DC
  Administered 2016-04-19 – 2016-04-20 (×2): 2 via ORAL
  Filled 2016-04-18 (×2): qty 2

## 2016-04-18 MED ORDER — PHENYLEPHRINE 40 MCG/ML (10ML) SYRINGE FOR IV PUSH (FOR BLOOD PRESSURE SUPPORT)
80.0000 ug | PREFILLED_SYRINGE | INTRAVENOUS | Status: DC | PRN
  Filled 2016-04-18: qty 10
  Filled 2016-04-18: qty 5

## 2016-04-18 MED ORDER — DIBUCAINE 1 % RE OINT: 1.0000 "application " | TOPICAL_OINTMENT | RECTAL | Status: DC | PRN

## 2016-04-18 MED ORDER — ONDANSETRON HCL 4 MG/2ML IJ SOLN: 4.0000 mg | Freq: Four times a day (QID) | INTRAMUSCULAR | Status: DC | PRN

## 2016-04-18 MED ORDER — TETANUS-DIPHTH-ACELL PERTUSSIS 5-2.5-18.5 LF-MCG/0.5 IM SUSP: 0.5000 mL | Freq: Once | INTRAMUSCULAR | Status: DC

## 2016-04-18 MED ORDER — LIDOCAINE HCL (PF) 1 % IJ SOLN
30.0000 mL | INTRAMUSCULAR | Status: DC | PRN
  Administered 2016-04-18: 30 mL via SUBCUTANEOUS
  Filled 2016-04-18: qty 30

## 2016-04-18 MED ORDER — LIDOCAINE HCL (PF) 1 % IJ SOLN
INTRAMUSCULAR | Status: DC | PRN
  Administered 2016-04-18: 6 mL via EPIDURAL
  Administered 2016-04-18: 6 mL
  Administered 2016-04-18: 6 mL via EPIDURAL

## 2016-04-18 MED ORDER — LACTATED RINGERS IV SOLN: 500.0000 mL | Freq: Once | INTRAVENOUS | Status: DC

## 2016-04-18 NOTE — Anesthesia Procedure Notes (Signed)
Epidural Patient location during procedure: OB Start time: 04/18/2016 7:47 AM End time: 04/18/2016 8:22 AM  Staffing Anesthesiologist: Jairo BenJACKSON, Charnay Nazario Performed: anesthesiologist   Preanesthetic Checklist Completed: patient identified, surgical consent, pre-op evaluation, timeout performed, IV checked, risks and benefits discussed and monitors and equipment checked  Epidural Patient position: sitting Prep: site prepped and draped and DuraPrep Patient monitoring: blood pressure, continuous pulse ox and heart rate Approach: midline Location: L2-L3 Injection technique: LOR air  Needle:  Needle type: Tuohy  Needle gauge: 17 G Needle length: 9 cm Needle insertion depth: 8 cm Catheter type: closed end flexible Catheter size: 19 Gauge Catheter at skin depth: 14 cm Test dose: positive (1% lidocaine: ringing in ears, cath removed and replaced)  Assessment Events: blood aspirated and positive IV test  Additional Notes Pt identified in Labor room.  Monitors applied. Working IV access confirmed. Sterile prep, drape lumbar spine.  1% lido local L 2,3.  #17ga Touhy LOR air at 8 cm L 2,3, cath in easily to 12 cm skin. Test dose: ringing in ears, then blood aspirated, cath removed.  #17 ga Touhy LOR air at 8 cm L 2,3, cath in easily to 14 cm skin.  Test dose OK and, cath dosed and infusion begun.  Patient asymptomatic, VSS, tolerated well.  Sandford Craze Tommaso Cavitt, MD

## 2016-04-18 NOTE — H&P (Signed)
Kristine Hurst is a 21 y.o. female G3P0020 with IUP at [redacted]w[redacted]d presenting for ROM at 0400. Pt states she has been having irregular, every 3-5 minutes contractions, associated with none vaginal bleeding for 2 hours..  Membranes are ruptured, clear fluid, with active fetal movement.   [facility] at [facility] 12 wks  Prenatal History/Complications:  anxiety  Past Medical History: Past Medical History:  Diagnosis Date  . Seasonal allergies     Past Surgical History: Past Surgical History:  Procedure Laterality Date  . INDUCED ABORTION  2013    Obstetrical History: OB History    Gravida Para Term Preterm AB Living   3 0 0 0 2 0   SAB TAB Ectopic Multiple Live Births   1 1 0 0 0       Social History: Social History   Social History  . Marital status: Single    Spouse name: N/A  . Number of children: N/A  . Years of education: N/A   Occupational History  . Buyer, retail tree   Social History Main Topics  . Smoking status: Former Smoker    Packs/day: 0.00    Years: 3.00    Types: Cigarettes  . Smokeless tobacco: Never Used     Comment: Pt states she is down to 3-4 cigarrettes a day  . Alcohol use No  . Drug use: No  . Sexual activity: Yes   Other Topics Concern  . None   Social History Narrative  . None    Family History: Family History  Problem Relation Age of Onset  . Diabetes Other     Great gradparents paternal  . Diabetes Other     Great gradparents maternal  . Hypertension Maternal Grandmother   . Diabetes Maternal Grandmother   . Hypertension Maternal Grandfather   . Allergies Maternal Grandfather   . Heart attack Maternal Grandfather   . Sleep apnea Maternal Grandfather   . Allergies Brother   . Heart disease Other     great maternal grandparent  . Lung cancer Other     great maternal grandparent  . Pancreatic cancer Other     great maternal grandparent  . Melanoma Other     great maternal grandparent  . Asthma Mother   .  Emphysema Other     great grandfather    Allergies: Allergies  Allergen Reactions  . Cefprozil Other (See Comments)    Reaction to Cefzil - unknown childhood allergic reaction    Prescriptions Prior to Admission  Medication Sig Dispense Refill Last Dose  . acetaminophen (TYLENOL) 325 MG tablet Take 650-975 mg by mouth daily as needed for headache.   Taking  . pantoprazole (PROTONIX) 20 MG tablet 20-40mg  po qday for GERD (Patient taking differently: Take 20 mg by mouth daily as needed for heartburn or indigestion (GERD). ) 60 tablet 1 Taking  . Prenatal Vit-Fe Fumarate-FA (PRENATAL COMPLETE) 14-0.4 MG TABS Take 1 tablet by mouth daily. 90 each 4 Taking  . promethazine (PHENERGAN) 25 MG tablet Take 1 tablet (25 mg total) by mouth every 6 (six) hours as needed for nausea or vomiting. 30 tablet 1         Review of Systems   Constitutional: Negative for fever and chills Eyes: Negative for visual disturbances Respiratory: Negative for shortness of breath, dyspnea Cardiovascular: Negative for chest pain or palpitations  Gastrointestinal: Negative for vomiting, diarrhea and constipation.  POSITIVE for abdominal pain (contractions) Genitourinary: Negative for dysuria and  urgency Musculoskeletal: Negative for back pain, joint pain, myalgias  Neurological: Negative for dizziness and headaches      Blood pressure 118/87, pulse 90, temperature 98.2 F (36.8 C), temperature source Oral, resp. rate 22, last menstrual period 08/25/2015, SpO2 98 %. General appearance: alert, cooperative and no distress Lungs: clear to auscultation bilaterally Heart: regular rate and rhythm Abdomen: soft, non-tender; bowel sounds normal Extremities: Homans sign is negative, no sign of DVT DTR's 2+ Presentation: cephalic Fetal monitoring  Baseline: 145 bpm, Variability: Good {> 6 bpm), Accelerations: Reactive and Decelerations: Absent Uterine activity  3-5 Dilation: 3 Effacement (%): 90 Station: -2,  -1 Exam by:: Camelia Enganielle Simpson RN   Prenatal labs: ABO, Rh: O/Positive/-- (08/17 0000) Antibody: Negative (08/17 0000) Rubella: !Error! RPR: NON REAC (12/26 1603)  HBsAg: Negative (08/17 0000)  HIV: NONREACTIVE (12/26 1603)  GBS:   neg  Cl[facility] [place] Prenatal Labs  Dating  LMP/6wk Blood type: O/Positive/-- (08/17 0000)   Genetic Screen declined Antibody:Negative (08/17 0000)  Anatomic US normal Rubella: Nonimmune (08/17 0000)  GTT Early:               Third trimester: neg RPR: Nonreactive (08/17 0000)   Flu vaccine Declined HBsAg: Negative (08/17 0000)   TDaP vaccine   03/19/16                                           Rhogam: n/a HIV: Non-reactive (08/17 0000)   Baby Food  Breast                                             GBS:  negative (For PCN allergy, check sensitivities)  Contraception  wants 3-2679yrs,  Pap:  Circumcision  Desired-- provided info @33wk    Pediatrician  unsure   Support Person  Christiane HaJonathan       No results found for this or any previous visit (from the past 24 hour(s)).  Assessment: Kristine Hurst is a 21 y.o. G3P0020 with an IUP at 4490w6d presenting for ROM/early labor  Plan: #Labor: expectant management #Pain:  Per request #FWB Cat 1   CRESENZO-DISHMAN,Sabrina Arriaga 04/18/2016, 6:39 AM

## 2016-04-18 NOTE — Anesthesia Preprocedure Evaluation (Addendum)
Anesthesia Evaluation  Patient identified by MRN, date of birth, ID band Patient awake    Reviewed: Allergy & Precautions, NPO status , Patient's Chart, lab work & pertinent test results  History of Anesthesia Complications Negative for: history of anesthetic complications  Airway Mallampati: III  TM Distance: >3 FB Neck ROM: Full    Dental  (+) Dental Advisory Given   Pulmonary Current Smoker,    breath sounds clear to auscultation       Cardiovascular negative cardio ROS   Rhythm:Regular Rate:Normal     Neuro/Psych negative neurological ROS     GI/Hepatic Neg liver ROS, GERD  Medicated and Poorly Controlled,  Endo/Other  Morbid obesity  Renal/GU negative Renal ROS     Musculoskeletal   Abdominal (+) + obese,   Peds  Hematology plt 273K   Anesthesia Other Findings   Reproductive/Obstetrics (+) Pregnancy                            Anesthesia Physical Anesthesia Plan  ASA: III  Anesthesia Plan: Epidural   Post-op Pain Management:    Induction:   Airway Management Planned: Natural Airway  Additional Equipment:   Intra-op Plan:   Post-operative Plan:   Informed Consent: I have reviewed the patients History and Physical, chart, labs and discussed the procedure including the risks, benefits and alternatives for the proposed anesthesia with the patient or authorized representative who has indicated his/her understanding and acceptance.     Plan Discussed with:   Anesthesia Plan Comments: (Patient identified. Risks/Benefits/Options discussed with patient including but not limited to bleeding, infection, nerve damage, paralysis, failed block, incomplete pain control, headache, blood pressure changes, nausea, vomiting, reactions to medication both or allergic, itching and postpartum back pain. Confirmed with bedside nurse the patient's most recent platelet count. Confirmed with  patient that they are not currently taking any anticoagulation, have any bleeding history or any family history of bleeding disorders. Patient expressed understanding and wished to proceed. All questions were answered. )        Anesthesia Quick Evaluation

## 2016-04-18 NOTE — MAU Note (Signed)
PT  SAYS [facility]  AT 0400-   VE  [facility]    1  CM .   DENIES HSV AND MRSA.  GBS-   NEG

## 2016-04-18 NOTE — Anesthesia Pain Management Evaluation Note (Signed)
  [facility] Pain Management Visit Note  Patient: Kristine BrilliantMolly Hurst, 21 y.o., female  "Hello I am a [facility]. We have an anesthesia team available at all times to provide care throughout the hospital, including epidural management and anesthesia for C-section. I don't know your plan for the delivery whether it a natural birth, water birth, IV sedation, nitrous supplementation, doula or epidural, but we want to meet your pain goals."   1.Was your pain managed to your expectations on prior hospitalizations?   No prior hospitalizations  2.What is your expectation for pain management during this hospitalization?     Epidural  3.How can we help you reach that goal? Epidural in situ.  Record the patient's initial score and the patient's pain goal.   Pain: 0  Pain Goal: 5 [facility] wants you to be able to say your pain was always managed very well.  Kailei Cowens L 04/18/2016

## 2016-04-19 LAB — RPR: RPR: NONREACTIVE

## 2016-04-19 NOTE — Anesthesia Postprocedure Evaluation (Signed)
Anesthesia Post Note  Patient: Kristine Hurst  Procedure(s) Performed: * No procedures listed *  Patient location during evaluation: Mother Baby Anesthesia Type: Epidural Level of consciousness: awake and alert and oriented Pain management: satisfactory to patient Vital Signs Assessment: post-procedure vital signs reviewed and stable Respiratory status: spontaneous breathing and nonlabored ventilation Cardiovascular status: stable Postop Assessment: no headache, no backache, no signs of nausea or vomiting, adequate PO intake and patient able to bend at knees (patient up walking) Anesthetic complications: no        Last Vitals:  Vitals:   04/18/16 2340 04/19/16 0231  BP: 108/61 119/78  Pulse: 78 75  Resp: 18 18  Temp: 36.9 C 36.8 C    Last Pain:  Vitals:   04/19/16 0754  TempSrc:   PainSc: 7    Pain Goal: Patients Stated Pain Goal: 3 (04/19/16 0754)               Madison HickmanGREGORY,Tanai Bouler

## 2016-04-19 NOTE — Anesthesia Postprocedure Evaluation (Signed)
Anesthesia Post Note  Patient: Kristine Hurst  Procedure(s) Performed: * No procedures listed *  Patient location during evaluation: Mother Baby Anesthesia Type: Epidural Level of consciousness: awake and alert and oriented Pain management: satisfactory to patient Vital Signs Assessment: post-procedure vital signs reviewed and stable Respiratory status: spontaneous breathing and nonlabored ventilation Cardiovascular status: stable Postop Assessment: no headache, no backache, no signs of nausea or vomiting, adequate PO intake and patient able to bend at knees (patient up walking) Anesthetic complications: no        Last Vitals:  Vitals:   04/18/16 2340 04/19/16 0231  BP: 108/61 119/78  Pulse: 78 75  Resp: 18 18  Temp: 36.9 C 36.8 C    Last Pain:  Vitals:   04/19/16 0754  TempSrc:   PainSc: 7    Pain Goal: Patients Stated Pain Goal: 3 (04/19/16 0754)               Kristine Hurst     

## 2016-04-19 NOTE — Progress Notes (Signed)
Post Partum Day #1 Subjective: no complaints, up ad lib and tolerating PO; bottlefeeding; plans on a LARC for contraception- not sure which  Objective: Blood pressure 119/78, pulse 75, temperature 98.3 F (36.8 C), temperature source Oral, resp. rate 18, height 5\' 7"  (1.702 m), weight (!) 144.2 kg (318 lb), last menstrual period 08/25/2015, SpO2 98 %, unknown if currently breastfeeding.  Physical Exam:  General: alert, cooperative and no distress Lochia: appropriate Uterine Fundus: firm DVT Evaluation: No evidence of DVT seen on physical exam.   Recent Labs  04/18/16 0649  HGB 12.9  HCT 36.6    Assessment/Plan: Plan for discharge tomorrow   LOS: 1 day   Sherrod Toothman CNM 04/19/2016, 8:02 AM

## 2016-04-20 MED ORDER — IBUPROFEN 600 MG PO TABS: 600.0000 mg | ORAL_TABLET | Freq: Four times a day (QID) | ORAL | 0 refills | Status: DC

## 2016-04-20 NOTE — Discharge Summary (Signed)
OB Discharge Summary  Patient Name: Kristine Hurst DOB: Aug 15, 1995 MRN: [id number]  Date of admission: 04/18/2016 Delivering MD: Sharen Counter A   Date of discharge: 04/20/2016  Admitting diagnosis: 38w, [place]  Intrauterine pregnancy: [redacted]w[redacted]d     Secondary diagnosis:Active Problems:   Normal labor  Additional problems:none     Discharge diagnosis: Term Pregnancy Delivered                                                                     Post partum procedures:none  Augmentation: none  Complications: None  Hospital course:  Onset of Labor With Vaginal Delivery     21 y.o. yo [id number] at [redacted]w[redacted]d was admitted in Active Labor on 04/18/2016. Patient had an uncomplicated labor course as follows:  Membrane Rupture Time/Date: 4:00 AM ,04/18/2016   Intrapartum Procedures: Episiotomy: None [1]                                         Lacerations:  Periurethral [8]  Patient had a delivery of a Viable infant. 04/18/2016  Information for the patient's newborn:  Elanor, Cale [[id number]]  Delivery Method: Vag-Spont    Pateint had an uncomplicated postpartum course.  She is ambulating, tolerating a regular diet, passing flatus, and urinating well. Patient is discharged home in stable condition on 04/20/16.   Physical exam  Vitals:   04/19/16 0231 04/19/16 1002 04/19/16 1807 04/20/16 0700  BP: 119/78 110/78 (!) 103/58 115/62  Pulse: 75 87 94 89  Resp: 18 20 18 18   Temp: 98.3 F (36.8 C) 98.1 F (36.7 C) 98.2 F (36.8 C) 98.4 F (36.9 C)  TempSrc: Oral Oral Oral Oral  SpO2:  99% 97%   Weight:      Height:       General: alert, cooperative and no distress Lochia: appropriate Uterine Fundus: firm Incision: N/A DVT Evaluation: No evidence of DVT seen on physical exam. Labs: Lab Results  Component Value Date   WBC 12.3 (H) 04/18/2016   HGB 12.9 04/18/2016   HCT 36.6 04/18/2016   MCV 90.8 04/18/2016   PLT 273 04/18/2016   CMP Latest Ref Rng & Units  04/13/2016  Glucose 65 - 99 mg/dL 244(W)  BUN 6 - 20 mg/dL 11  Creatinine 1.02 - 7.25 mg/dL 3.66  Sodium 440 - 347 mmol/L 136  Potassium 3.5 - 5.1 mmol/L 3.9  Chloride 101 - 111 mmol/L 105  CO2 22 - 32 mmol/L 19(L)  Calcium 8.9 - 10.3 mg/dL 8.9  Total Protein 6.1 - 8.1 g/dL -  Total Bilirubin 0.2 - 1.2 mg/dL -  Alkaline Phos 33 - 425 U/L -  AST 10 - 30 U/L -  ALT 6 - 29 U/L -    Discharge instruction: per After Visit Summary and "Baby and Me Booklet".  After Visit Meds:  Allergies as of 04/20/2016      Reactions   Cefprozil Hives, Other (See Comments)   Reaction to Cefzil - unknown childhood allergic reaction      Medication List    STOP taking these medications   promethazine 25 MG tablet Commonly known  as:  PHENERGAN     TAKE these medications   ibuprofen 600 MG tablet Commonly known as:  ADVIL,MOTRIN Take 1 tablet (600 mg total) by mouth every 6 (six) hours.   pantoprazole 20 MG tablet Commonly known as:  PROTONIX 20-40mg  po qday for GERD What changed:  how much to take  how to take this  when to take this  reasons to take this  additional instructions   PRENATAL COMPLETE 14-0.4 MG Tabs Take 1 tablet by mouth daily.       Diet: routine diet  Activity: Advance as tolerated. Pelvic rest for 6 weeks.   Outpatient follow up:6 weeks Follow up Appt:Future Appointments Date Time Provider Department Center  04/23/2016 9:00 AM Tereso NewcomerUgonna A Anyanwu, MD [facility]   Follow up visit: No Follow-up on file.  Postpartum contraception: IUD Mirena  Newborn Data: Live born female  Birth Weight: 7 lb 0.5 oz (3189 g) APGAR: 8, 9  Baby Feeding: Breast Disposition:home with mother   04/20/2016 Wyvonnia DuskyMarie Lawson, CNM

## 2016-04-23 ENCOUNTER — Encounter: Payer: Medicaid Other | Admitting: Obstetrics & Gynecology

## 2016-06-12 ENCOUNTER — Encounter: Payer: Self-pay | Admitting: Physician Assistant

## 2016-07-09 ENCOUNTER — Encounter: Payer: Self-pay | Admitting: Physician Assistant

## 2016-09-25 ENCOUNTER — Encounter: Payer: Self-pay | Admitting: Physician Assistant

## 2016-12-11 ENCOUNTER — Encounter (HOSPITAL_COMMUNITY): Payer: Self-pay | Admitting: Family Medicine

## 2016-12-11 ENCOUNTER — Ambulatory Visit (HOSPITAL_COMMUNITY)
Admission: EM | Admit: 2016-12-11 | Discharge: 2016-12-11 | Disposition: A | Discharge status: Home / Self Care | Payer: Self-pay | Attending: Family Medicine | Admitting: Family Medicine

## 2016-12-11 DIAGNOSIS — J069 Acute upper respiratory infection, unspecified: Secondary | ICD-10-CM

## 2016-12-11 MED ORDER — HYDROCODONE-HOMATROPINE 5-1.5 MG/5ML PO SYRP: 5.0000 mL | ORAL_SOLUTION | Freq: Four times a day (QID) | ORAL | 0 refills | Status: DC | PRN

## 2016-12-11 NOTE — ED Triage Notes (Signed)
Pt here for URI symptoms since yesterday. Took day quil, night quil and ibuprofen yesterday and last night.

## 2016-12-11 NOTE — ED Provider Notes (Signed)
[facility]   [id number] 12/11/16 Arrival Time: 1235   SUBJECTIVE:  Kristine Hurst is a 21 y.o. female who presents to the urgent care with complaint of URI symptoms since yesterday. Took dayquil, night quil and ibuprofen yesterday and last night.   Patient is a smoker but has no history of asthma. She denies nausea and vomiting and has had no diarrhea although her stools have been loose and green colored.  She is a stay-at-home mom. Her 77 month old son was sick last week but is now better having been treated with symptomatic control thinking that he had a virus.    Past Medical History:  Diagnosis Date  . Medical history non-contributory   . Seasonal allergies    Family History  Problem Relation Age of Onset  . Diabetes Other        Great gradparents paternal  . Diabetes Other        Great gradparents maternal  . Hypertension Maternal Grandmother   . Diabetes Maternal Grandmother   . Hypertension Maternal Grandfather   . Allergies Maternal Grandfather   . Heart attack Maternal Grandfather   . Sleep apnea Maternal Grandfather   . Allergies Brother   . Heart disease Other        great maternal grandparent  . Lung cancer Other        great maternal grandparent  . Pancreatic cancer Other        great maternal grandparent  . Melanoma Other        great maternal grandparent  . Asthma Mother   . Emphysema Other        great grandfather   Social History   Social History  . Marital status: Single    Spouse name: N/A  . Number of children: N/A  . Years of education: N/A   Occupational History  . Buyer, retail tree   Social History Main Topics  . Smoking status: Current Every Day Smoker    Packs/day: 0.00    Years: 3.00    Types: Cigarettes  . Smokeless tobacco: Never Used     Comment: Pt states she is down to 3-4 cigarrettes a day  . Alcohol use No  . Drug use: No  . Sexual activity: Yes    Birth control/ protection: None   Other Topics  Concern  . Not on file   Social History Narrative  . No narrative on file   No outpatient prescriptions have been marked as taking for the 12/11/16 encounter [facility] Encounter).   Allergies  Allergen Reactions  . Cefprozil Hives and Other (See Comments)    Reaction to Cefzil - unknown childhood allergic reaction      ROS: As per HPI, remainder of ROS negative.   OBJECTIVE:   Vitals:   12/11/16 1335  BP: (!) 102/56  Pulse: (!) 117  Resp: 20  Temp: 99.6 F (37.6 C)  SpO2: 99%     General appearance: alert; no distress Eyes: PERRL; EOMI; conjunctiva normal HENT: normocephalic; atraumatic; TMs normal, canal normal, external ears normal without trauma; nasal mucosa normal; oral mucosa normal Neck: supple Lungs: clear to auscultation bilaterally Heart: regular rate and rhythm Abdomen: soft, non-tender; bowel sounds normal; no masses or organomegaly; no guarding or rebound tenderness Back: no CVA tenderness Extremities: no cyanosis or edema; symmetrical with no gross deformities Skin: warm and dry Neurologic: normal gait; grossly normal Psychological: alert and cooperative; normal mood and affect  Labs:  Results for orders placed or performed during the hospital encounter of 04/18/16  OB RESULT CONSOLE Group B Strep  Result Value Ref Range   GBS Negative   CBC  Result Value Ref Range   WBC 12.3 (H) 4.0 - 10.5 K/uL   RBC 4.03 3.87 - 5.11 MIL/uL   Hemoglobin 12.9 12.0 - 15.0 g/dL   HCT 40.9 81.1 - 91.4 %   MCV 90.8 78.0 - 100.0 fL   MCH 32.0 26.0 - 34.0 pg   MCHC 35.2 30.0 - 36.0 g/dL   RDW 78.2 95.6 - 21.3 %   Platelets 273 150 - 400 K/uL  RPR  Result Value Ref Range   RPR Ser Ql Non Reactive Non Reactive  Type and screen [facility]  Result Value Ref Range   ABO/RH(D) O POS    Antibody Screen NEG    Sample Expiration 04/21/2016   ABO/Rh  Result Value Ref Range   ABO/RH(D) O POS     Labs Reviewed - No data to display  No  results found.     ASSESSMENT & PLAN:  1. Upper respiratory tract infection, unspecified type     Meds ordered this encounter  Medications  . HYDROcodone-homatropine (HYDROMET) 5-1.5 MG/5ML syrup    Sig: Take 5 mLs by mouth every 6 (six) hours as needed for cough.    Dispense:  60 mL    Refill:  0    Reviewed expectations re: course of current medical issues. Questions answered. Outlined signs and symptoms indicating need for more acute intervention. Patient verbalized understanding. After Visit Summary given.    Procedures:      Elvina Sidle, MD 12/11/16 [phone]

## 2017-03-06 ENCOUNTER — Ambulatory Visit (HOSPITAL_COMMUNITY)
Admission: EM | Admit: 2017-03-06 | Discharge: 2017-03-06 | Disposition: A | Discharge status: Home / Self Care | Payer: Self-pay | Attending: Internal Medicine | Admitting: Internal Medicine

## 2017-03-06 ENCOUNTER — Encounter (HOSPITAL_COMMUNITY): Payer: Self-pay | Admitting: Emergency Medicine

## 2017-03-06 ENCOUNTER — Ambulatory Visit (INDEPENDENT_AMBULATORY_CARE_PROVIDER_SITE_OTHER): Payer: Self-pay

## 2017-03-06 DIAGNOSIS — R05 Cough: Secondary | ICD-10-CM

## 2017-03-06 DIAGNOSIS — J40 Bronchitis, not specified as acute or chronic: Secondary | ICD-10-CM

## 2017-03-06 MED ORDER — PREDNISONE 20 MG PO TABS: 40.0000 mg | ORAL_TABLET | Freq: Every day | ORAL | 0 refills | Status: AC

## 2017-03-06 NOTE — Discharge Instructions (Signed)
Push fluids to ensure adequate hydration and keep secretions thin.  Complete course of steroids. Continue to decrease to quite smoking. If symptoms worsen or do not improve in the next week to return to be seen or to follow up with PCP.

## 2017-03-06 NOTE — ED Provider Notes (Signed)
[facility]    CSN: [id number] Arrival date & time: 03/06/17  1534     History   Chief Complaint Chief Complaint  Patient presents with  . Cough    HPI Kristine Hurst is a 21 y.o. female.   Kristine Hurst presents with complaints of cough for the past month which has worsened over the past 1.5 week. She states it becomes so severe it makes her vomit and leak urine. Causes sore throat. Without chest pain. Shortness of breath only with coughing. Without leg pain or swelling. Without recent travel. Smokes 3-5 cigarettes a day. Has had bronchitis in the past. Without history of asthma. Has taken nyquil once and has been using cough drops which do not help. Currently on menstrual cycle. Without congestion or ear pain.    ROS per HPI.       Past Medical History:  Diagnosis Date  . Medical history non-contributory   . Seasonal allergies     Patient Active Problem List   Diagnosis Date Noted  . Normal labor 04/18/2016  . BMI 45.0-49.9, adult (HCC) 04/04/2016  . UTI in pregnancy 01/10/2016  . Supervision of high-risk pregnancy 11/08/2015  . Rubella non-immune status, antepartum 11/08/2015  . Obesity affecting pregnancy, antepartum 11/08/2015  . Abnormal CT scan, chest 05/17/2012    Past Surgical History:  Procedure Laterality Date  . INDUCED ABORTION  2013  . NO PAST SURGERIES      OB History    Gravida Para Term Preterm AB Living   3 1 1  0 2 1   SAB TAB Ectopic Multiple Live Births   1 1 0 0 1       Home Medications    Prior to Admission medications   Medication Sig Start Date End Date Taking? Authorizing Provider  predniSONE (DELTASONE) 20 MG tablet Take 2 tablets (40 mg total) by mouth daily with breakfast for 5 days. 03/06/17 03/11/17  Georgetta Haber, NP    Family History Family History  Problem Relation Age of Onset  . Diabetes Other        Great gradparents paternal  . Diabetes Other        Great gradparents maternal  . Hypertension Maternal  Grandmother   . Diabetes Maternal Grandmother   . Hypertension Maternal Grandfather   . Allergies Maternal Grandfather   . Heart attack Maternal Grandfather   . Sleep apnea Maternal Grandfather   . Allergies Brother   . Heart disease Other        great maternal grandparent  . Lung cancer Other        great maternal grandparent  . Pancreatic cancer Other        great maternal grandparent  . Melanoma Other        great maternal grandparent  . Asthma Mother   . Emphysema Other        great grandfather    Social History Social History   Tobacco Use  . Smoking status: Current Every Day Smoker    Packs/day: 0.20    Years: 3.00    Pack years: 0.60    Types: Cigarettes  . Smokeless tobacco: Never Used  . Tobacco comment: Pt states she is down to 3-4 cigarrettes a day  Substance Use Topics  . Alcohol use: Yes    Alcohol/week: 0.0 oz  . Drug use: No     Allergies   Cefprozil   Review of Systems Review of Systems   Physical Exam Triage Vital Signs  ED Triage Vitals  Enc Vitals Group     BP 03/06/17 1552 (!) 124/91     Pulse Rate 03/06/17 1549 88     Resp 03/06/17 1549 16     Temp 03/06/17 1549 98.5 F (36.9 C)     Temp Source 03/06/17 1549 Oral     SpO2 03/06/17 1549 97 %     Weight 03/06/17 1550 292 lb (132.5 kg)     Height 03/06/17 1550 5\' 7"  (1.702 m)     Head Circumference --      Peak Flow --      Pain Score 03/06/17 1550 3     Pain Loc --      Pain Edu? --      Excl. in GC? --    No data found.  Updated Vital Signs BP (!) 124/91   Pulse 88   Temp 98.5 F (36.9 C) (Oral)   Resp 16   Ht 5\' 7"  (1.702 m)   Wt 292 lb (132.5 kg)   LMP 03/05/2017   SpO2 97%   BMI 45.73 kg/m   Visual Acuity Right Eye Distance:   Left Eye Distance:   Bilateral Distance:    Right Eye Near:   Left Eye Near:    Bilateral Near:     Physical Exam  Constitutional: She is oriented to person, place, and time. She appears well-developed and well-nourished. No  distress.  HENT:  Head: Normocephalic and atraumatic.  Right Ear: Tympanic membrane, external ear and ear canal normal.  Left Ear: Tympanic membrane, external ear and ear canal normal.  Nose: Nose normal.  Mouth/Throat: Uvula is midline, oropharynx is clear and moist and mucous membranes are normal. No tonsillar exudate.  Eyes: Conjunctivae and EOM are normal. Pupils are equal, round, and reactive to light.  Cardiovascular: Normal rate, regular rhythm and normal heart sounds.  Pulmonary/Chest: Effort normal and breath sounds normal.  Strong dry cough noted  Neurological: She is alert and oriented to person, place, and time.  Skin: Skin is warm and dry.     UC Treatments / Results  Labs (all labs ordered are listed, but only abnormal results are displayed) Labs Reviewed - No data to display  EKG  EKG Interpretation None       Radiology Dg Chest 2 View  Result Date: 03/06/2017 CLINICAL DATA:  Patient states that she's had cough x 1 month, some production with cough. Smoker- 1/3 ppd. EXAM: CHEST  2 VIEW COMPARISON:  12/29/2011 FINDINGS: Normal cardiac silhouette. Nodule in the mid RIGHT lung measuring 8 mm not changed. No effusion, infiltrate, pneumothorax. No acute osseous abnormality. IMPRESSION: 1.  No acute cardiopulmonary process. 2. Stable RIGHT lung nodule. Electronically Signed   By: Genevive BiStewart  Edmunds M.D.   On: 03/06/2017 16:46    Procedures Procedures (including critical care time)  Medications Ordered in UC Medications - No data to display   Initial Impression / Assessment and Plan / UC Course  I have reviewed the triage vital signs and the nursing notes.  Pertinent labs & imaging results that were available during my care of the patient were reviewed by me and considered in my medical decision making (see chart for details).     Xray without acute findings. Stable, non toxic in appearance. 5 days of prednisone. Smoking cessation discussed. Return precautions  provided. If symptoms worsen or do not improve in the next week to return to be seen or to follow up with PCP.  Patient verbalized  understanding and agreeable to plan.    Final Clinical Impressions(s) / UC Diagnoses   Final diagnoses:  Bronchitis    ED Discharge Orders        Ordered    predniSONE (DELTASONE) 20 MG tablet  Daily with breakfast     03/06/17 1659       Controlled Substance Prescriptions  Controlled Substance Registry consulted? Not Applicable   Georgetta HaberBurky, Amela Handley B, NP 03/06/17 1700

## 2017-03-06 NOTE — ED Triage Notes (Signed)
PT reports severe cough for 1 month. PT reports she is coughing so hard she is loosing bladder control and vomits.

## 2017-03-25 ENCOUNTER — Encounter: Payer: Self-pay | Admitting: Emergency Medicine

## 2017-03-25 ENCOUNTER — Emergency Department
Admission: EM | Admit: 2017-03-25 | Discharge: 2017-03-25 | Disposition: A | Discharge status: Home / Self Care | Payer: Medicaid Other | Attending: Emergency Medicine | Admitting: Emergency Medicine

## 2017-03-25 ENCOUNTER — Emergency Department: Payer: Medicaid Other

## 2017-03-25 DIAGNOSIS — J40 Bronchitis, not specified as acute or chronic: Secondary | ICD-10-CM | POA: Insufficient documentation

## 2017-03-25 DIAGNOSIS — R918 Other nonspecific abnormal finding of lung field: Secondary | ICD-10-CM | POA: Diagnosis not present

## 2017-03-25 DIAGNOSIS — R05 Cough: Secondary | ICD-10-CM | POA: Diagnosis present

## 2017-03-25 DIAGNOSIS — F17210 Nicotine dependence, cigarettes, uncomplicated: Secondary | ICD-10-CM | POA: Diagnosis not present

## 2017-03-25 DIAGNOSIS — Z79899 Other long term (current) drug therapy: Secondary | ICD-10-CM | POA: Insufficient documentation

## 2017-03-25 MED ORDER — ALBUTEROL SULFATE HFA 108 (90 BASE) MCG/ACT IN AERS: 2.0000 | INHALATION_SPRAY | Freq: Four times a day (QID) | RESPIRATORY_TRACT | 0 refills | Status: DC | PRN

## 2017-03-25 MED ORDER — BENZONATATE 100 MG PO CAPS: 100.0000 mg | ORAL_CAPSULE | Freq: Three times a day (TID) | ORAL | 0 refills | Status: DC | PRN

## 2017-03-25 MED ORDER — AZITHROMYCIN 250 MG PO TABS: ORAL_TABLET | ORAL | 0 refills | Status: DC

## 2017-03-25 NOTE — ED Notes (Signed)
Cough x3835month but getting worse per pt. Pt state she felt like she pulled a muscle from coughing. Denies fever

## 2017-03-25 NOTE — ED Provider Notes (Signed)
[facility] Emergency Department Provider Note  ____________________________________________  Time seen: Approximately 11:51 AM  I have reviewed the triage vital signs and the nursing notes.   HISTORY  Chief Complaint Cough    HPI Kristine Hurst is a 22 y.o. female that presents to the emergency department for evaluation of productive cough for 1.5 months.  She is coughing up yellow and green phlegm.  Patient was seen in urgent care 2 weeks ago and given a prescription for prednisone.  Cough mildly improved on prednisone but then returned.  She smokes a couple of cigarettes per day.  No history of asthma.  No fever, shortness of breath, chest pain, nausea, vomiting, abdominal pain.  Past Medical History:  Diagnosis Date  . Medical history non-contributory   . Seasonal allergies     Patient Active Problem List   Diagnosis Date Noted  . Normal labor 04/18/2016  . BMI 45.0-49.9, adult (HCC) 04/04/2016  . UTI in pregnancy 01/10/2016  . Supervision of high-risk pregnancy 11/08/2015  . Rubella non-immune status, antepartum 11/08/2015  . Obesity affecting pregnancy, antepartum 11/08/2015  . Abnormal CT scan, chest 05/17/2012    Past Surgical History:  Procedure Laterality Date  . INDUCED ABORTION  2013  . NO PAST SURGERIES      Prior to Admission medications   Medication Sig Start Date End Date Taking? Authorizing Provider  albuterol (PROVENTIL HFA;VENTOLIN HFA) 108 (90 Base) MCG/ACT inhaler Inhale 2 puffs into the lungs every 6 (six) hours as needed for wheezing or shortness of breath. 03/25/17   Enid Derry, PA-C  azithromycin (ZITHROMAX Z-PAK) 250 MG tablet Take 2 tablets (500 mg) on  Day 1,  followed by 1 tablet (250 mg) once daily on Days 2 through 5. 03/25/17   Enid Derry, PA-C  benzonatate (TESSALON PERLES) 100 MG capsule Take 1 capsule (100 mg total) by mouth 3 (three) times daily as needed for cough. 03/25/17 03/25/18  Enid Derry, PA-C     Allergies Cefprozil  Family History  Problem Relation Age of Onset  . Diabetes Other        Great gradparents paternal  . Diabetes Other        Great gradparents maternal  . Hypertension Maternal Grandmother   . Diabetes Maternal Grandmother   . Hypertension Maternal Grandfather   . Allergies Maternal Grandfather   . Heart attack Maternal Grandfather   . Sleep apnea Maternal Grandfather   . Allergies Brother   . Heart disease Other        great maternal grandparent  . Lung cancer Other        great maternal grandparent  . Pancreatic cancer Other        great maternal grandparent  . Melanoma Other        great maternal grandparent  . Asthma Mother   . Emphysema Other        great grandfather    Social History Social History   Tobacco Use  . Smoking status: Current Every Day Smoker    Packs/day: 0.20    Years: 3.00    Pack years: 0.60    Types: Cigarettes  . Smokeless tobacco: Never Used  . Tobacco comment: Pt states she is down to 3-4 cigarrettes a day  Substance Use Topics  . Alcohol use: Yes    Alcohol/week: 0.0 oz  . Drug use: No     Review of Systems  Constitutional: No fever/chills Eyes: No visual changes. No discharge. ENT: Negative for congestion  and rhinorrhea. Cardiovascular: No chest pain. Respiratory: Positive for cough.  Gastrointestinal: No abdominal pain.  No nausea, no vomiting.  No diarrhea.  No constipation. Musculoskeletal: Negative for musculoskeletal pain. Skin: Negative for rash, abrasions, lacerations, ecchymosis. Neurological: Negative for headaches.   ____________________________________________   PHYSICAL EXAM:  VITAL SIGNS: ED Triage Vitals  Enc Vitals Group     BP 03/25/17 1104 (!) 144/95     Pulse Rate 03/25/17 1104 89     Resp 03/25/17 1104 20     Temp 03/25/17 1104 98.9 F (37.2 C)     Temp Source 03/25/17 1104 Oral     SpO2 03/25/17 1104 98 %     Weight 03/25/17 1105 292 lb (132.5 kg)     Height 03/25/17  1105 5\' 7"  (1.702 m)     Head Circumference --      Peak Flow --      Pain Score 03/25/17 1143 7     Pain Loc --      Pain Edu? --      Excl. in GC? --      Constitutional: Alert and oriented. Well appearing and in no acute distress. Eyes: Conjunctivae are normal. PERRL. EOMI. No discharge. Head: Atraumatic. ENT: No frontal and maxillary sinus tenderness.      Ears: Tympanic membranes pearly gray with good landmarks. No discharge.      Nose: No congestion/rhinnorhea.      Mouth/Throat: Mucous membranes are moist. Oropharynx non-erythematous. Tonsils not enlarged. No exudates. Uvula midline. Neck: No stridor.   Hematological/Lymphatic/Immunilogical: No cervical lymphadenopathy. Cardiovascular: Normal rate, regular rhythm.  Good peripheral circulation. Respiratory: Normal respiratory effort without tachypnea or retractions. Lungs CTAB. Good air entry to the bases with no decreased or absent breath sounds. Gastrointestinal: Bowel sounds 4 quadrants. Soft and nontender to palpation. No guarding or rigidity. No palpable masses. No distention. Musculoskeletal: Full range of motion to all extremities. No gross deformities appreciated. Neurologic:  Normal speech and language. No gross focal neurologic deficits are appreciated.  Skin:  Skin is warm, dry and intact. No rash noted.   ____________________________________________   LABS (all labs ordered are listed, but only abnormal results are displayed)  Labs Reviewed - No data to display ____________________________________________  EKG   ____________________________________________  RADIOLOGY Lexine BatonI, Shloka Baldridge, personally viewed and evaluated these images (plain radiographs) as part of my medical decision making, as well as reviewing the written report by the radiologist.  Dg Chest 2 View  Result Date: 03/25/2017 CLINICAL DATA:  Cough EXAM: CHEST  2 VIEW COMPARISON:  March 06, 2017 and November 02, 2011 FINDINGS: There is a  stable 8 mm nodular opacity in the right mid lung. There is a stable 5 mm nodular opacity in the left mid lung. Lungs elsewhere clear. Heart size and pulmonary vascularity are normal. No adenopathy. No bone lesions. IMPRESSION: Stable nodular opacities bilaterally. Stability of these nodular opacities over several years is indicative of benign etiology. No edema or consolidation. Stable cardiac silhouette. Electronically Signed   By: Bretta BangWilliam  Woodruff III M.D.   On: 03/25/2017 11:42    ____________________________________________    PROCEDURES  Procedure(s) performed:    Procedures    Medications - No data to display   ____________________________________________   INITIAL IMPRESSION / ASSESSMENT AND PLAN / ED COURSE  Pertinent labs & imaging results that were available during my care of the patient were reviewed by me and considered in my medical decision making (see chart for details).  Review of  the [place] CSRS was performed in accordance of the [facility] prior to dispensing any controlled drugs.   Patient's diagnosis is consistent with bronchitis. Vital signs and exam are reassuring.  Chest x-ray negative for acute cardiopulmonary processes.  Chest and x-ray consistent with previously seen lung nodules that have not changed in size.  These results were discussed with patient and she was aware of previous nodules and will follow up with PCP.  Patient appears well and is staying well hydrated. Patient should alternate tylenol and ibuprofen for fever. Patient feels comfortable going home. Patient will be discharged home with prescriptions for azithromycin, albuterol inhaler, tessalon pearls. Patient is to follow up with PCP as needed or otherwise directed. Patient is given ED precautions to return to the ED for any worsening or new symptoms.     ____________________________________________  FINAL CLINICAL IMPRESSION(S) / ED DIAGNOSES  Final diagnoses:  Bronchitis  Lung nodules       NEW MEDICATIONS STARTED DURING THIS VISIT:  ED Discharge Orders        Ordered    azithromycin (ZITHROMAX Z-PAK) 250 MG tablet     03/25/17 1221    albuterol (PROVENTIL HFA;VENTOLIN HFA) 108 (90 Base) MCG/ACT inhaler  Every 6 hours PRN     03/25/17 1221    benzonatate (TESSALON PERLES) 100 MG capsule  3 times daily PRN     03/25/17 1221          This chart was dictated using voice recognition software/Dragon. Despite best efforts to proofread, errors can occur which can change the meaning. Any change was purely unintentional.    Enid Derry, PA-C 03/25/17 1624    Jeanmarie Plant, MD 03/26/17 1504

## 2017-03-25 NOTE — ED Triage Notes (Signed)
Pt reports cough for over a month. [facility] given [facility] which helped but then the symptoms came back.Denies fevers, night sweats, other symptoms.

## 2017-03-30 ENCOUNTER — Other Ambulatory Visit: Payer: Self-pay

## 2017-03-30 ENCOUNTER — Emergency Department (HOSPITAL_COMMUNITY)
Admission: EM | Admit: 2017-03-30 | Discharge: 2017-03-30 | Disposition: A | Discharge status: Home / Self Care | Payer: Medicaid Other | Attending: Emergency Medicine | Admitting: Emergency Medicine

## 2017-03-30 ENCOUNTER — Encounter (HOSPITAL_COMMUNITY): Payer: Self-pay | Admitting: Nurse Practitioner

## 2017-03-30 DIAGNOSIS — R109 Unspecified abdominal pain: Secondary | ICD-10-CM

## 2017-03-30 DIAGNOSIS — M7918 Myalgia, other site: Secondary | ICD-10-CM | POA: Diagnosis present

## 2017-03-30 DIAGNOSIS — F17210 Nicotine dependence, cigarettes, uncomplicated: Secondary | ICD-10-CM | POA: Diagnosis not present

## 2017-03-30 MED ORDER — METHOCARBAMOL 500 MG PO TABS: 500.0000 mg | ORAL_TABLET | Freq: Three times a day (TID) | ORAL | 0 refills | Status: DC | PRN

## 2017-03-30 MED ORDER — NAPROXEN 500 MG PO TABS: 500.0000 mg | ORAL_TABLET | Freq: Two times a day (BID) | ORAL | 0 refills | Status: DC

## 2017-03-30 MED ORDER — BENZONATATE 100 MG PO CAPS: 100.0000 mg | ORAL_CAPSULE | Freq: Three times a day (TID) | ORAL | 0 refills | Status: DC

## 2017-03-30 MED ORDER — OXYCODONE-ACETAMINOPHEN 5-325 MG PO TABS
1.0000 | ORAL_TABLET | Freq: Once | ORAL | Status: AC
  Administered 2017-03-30: 1 via ORAL
  Filled 2017-03-30: qty 1

## 2017-03-30 NOTE — Discharge Instructions (Signed)
Back/Side Pain:  I have prescribed you an anti-inflammatory medication and a muscle relaxer.   Naproxen is a nonsteroidal anti-inflammatory medication that will help with pain and swelling. Be sure to take this medication as prescribed with food, 1 pill every 12 hours,  It should be taken with food, as it can cause stomach upset, and more seriously, stomach bleeding. Do not take other nonsteroidal anti-inflammatory medications with this such as Advil, Motrin, or Aleve.   Robaxin is the muscle relaxer I have prescribed, this is meant to help with muscle tightness. Be aware that this medication may make you drowsy therefore the first time you take this it should be at a time you are in an environment where you can rest. Do not drive or operate heavy machinery when taking this medication.   In addition you may also take Tylenol. Tylenol is generally safe, though you should not take more than 8 of the extra strength (500mg ) pills a day.  The application of heat can help soothe the pain.  You may also ask the pharmacist about lidocaine patches which are topical patches you can place directly over areas of pain.   You will need to rest for the next 48 hours to help improve the area.   Your pain should get better over the next 2 weeks.  You will need to follow up with  Your primary healthcare provider in 1-2 weeks for reassessment, if you do not have a primary care provider one is provided in your discharge instructions. However if you develop severe or worsening pain, low back pain with fever, numbness, weakness, loss of bowel or bladder control, or inability to walk or urinate, you should return to the ER immediately.  Additionally return for difficulty breathing or chest pain or any other concerns you may have.   Please follow up with your doctor this week for a recheck if still having symptoms. If you do not have a primary care doctor one is provided in your discharge instructions- call to set up an  appointment next week.

## 2017-03-30 NOTE — ED Triage Notes (Signed)
Patient brought in by EMS for pain in right sided pain for a pulled muscle and has the pain for a week. Patient this morning the patient heard a pop and has not been able to move since without severe pain.

## 2017-03-30 NOTE — ED Provider Notes (Signed)
[facility]-EMERGENCY DEPT Provider Note   CSN: [id number] Arrival date & time: 03/30/17  1036     History   Chief Complaint Chief Complaint  Patient presents with  . Right sided pain pulled muscle    HPI Kristine Hurst is a 22 y.o. female with a hx obesity who presents to the ED with complaint of R side pain that has been progressively worsening x 4-5 days. Upon further prompting patient points to right lower.mid back which wraps around to side, not to abdomen, for area of pain. States pain is constant, rates the pain a 10/10 in severity, better with remaining still, worse with movement and coughing. Describes the pain as sharp/grabbing sensation. Tried ibuprofen and Midol with minimal improvement. This morning states that she was reaching for something and felt a popping sensation prompting ED visit. No trauma. Denies numbness, tingling, weakness, incontinence to bowel/bladder, fever, chills, IV drug use, or hx of cancer.  Denies dysuria, hematuria, urgency, or urinary frequency. No dyspnea. Seen 01/16 in ED for cough, given prescriptions for Azithromycin, Tessalon, and Albuterol, has had the abx and inhaler, not the tessalon, reports some improvement.  HPI  Past Medical History:  Diagnosis Date  . Medical history non-contributory   . Seasonal allergies     Patient Active Problem List   Diagnosis Date Noted  . Normal labor 04/18/2016  . BMI 45.0-49.9, adult (HCC) 04/04/2016  . UTI in pregnancy 01/10/2016  . Supervision of high-risk pregnancy 11/08/2015  . Rubella non-immune status, antepartum 11/08/2015  . Obesity affecting pregnancy, antepartum 11/08/2015  . Abnormal CT scan, chest 05/17/2012    Past Surgical History:  Procedure Laterality Date  . INDUCED ABORTION  2013  . NO PAST SURGERIES      OB History    Gravida Para Term Preterm AB Living   3 1 1  0 2 1   SAB TAB Ectopic Multiple Live Births   1 1 0 0 1       Home Medications    Prior  to Admission medications   Medication Sig Start Date End Date Taking? Authorizing Provider  albuterol (PROVENTIL HFA;VENTOLIN HFA) 108 (90 Base) MCG/ACT inhaler Inhale 2 puffs into the lungs every 6 (six) hours as needed for wheezing or shortness of breath. 03/25/17   Enid DerryWagner, Ashley, PA-C  azithromycin (ZITHROMAX Z-PAK) 250 MG tablet Take 2 tablets (500 mg) on  Day 1,  followed by 1 tablet (250 mg) once daily on Days 2 through 5. 03/25/17   Enid DerryWagner, Ashley, PA-C  benzonatate (TESSALON PERLES) 100 MG capsule Take 1 capsule (100 mg total) by mouth 3 (three) times daily as needed for cough. 03/25/17 03/25/18  Enid DerryWagner, Ashley, PA-C    Family History Family History  Problem Relation Age of Onset  . Diabetes Other        Great gradparents paternal  . Diabetes Other        Great gradparents maternal  . Hypertension Maternal Grandmother   . Diabetes Maternal Grandmother   . Hypertension Maternal Grandfather   . Allergies Maternal Grandfather   . Heart attack Maternal Grandfather   . Sleep apnea Maternal Grandfather   . Allergies Brother   . Heart disease Other        great maternal grandparent  . Lung cancer Other        great maternal grandparent  . Pancreatic cancer Other        great maternal grandparent  . Melanoma Other  great maternal grandparent  . Asthma Mother   . Emphysema Other        great grandfather    Social History Social History   Tobacco Use  . Smoking status: Current Every Day Smoker    Packs/day: 0.20    Years: 3.00    Pack years: 0.60    Types: Cigarettes  . Smokeless tobacco: Never Used  . Tobacco comment: Pt states she is down to 3-4 cigarrettes a day  Substance Use Topics  . Alcohol use: Yes    Alcohol/week: 0.0 oz  . Drug use: No     Allergies   Cefprozil   Review of Systems Review of Systems  Constitutional: Negative for chills and fever.  Respiratory: Positive for cough. Negative for shortness of breath.   Cardiovascular: Negative for  chest pain.  Gastrointestinal: Negative for abdominal pain, nausea and vomiting.  Genitourinary: Negative for dysuria, frequency, hematuria, urgency, vaginal bleeding and vaginal discharge.  Musculoskeletal:       Positive for right back/side pain  Neurological: Negative for weakness and numbness.       Negative for incontinence or saddle anesthesia.     Physical Exam Updated Vital Signs BP 113/65 (BP Location: Left Arm)   Pulse 89   Temp 98.6 F (37 C) (Oral)   Resp 20   Ht 5\' 7"  (1.702 m)   Wt 132.5 kg (292 lb)   LMP 03/05/2017   SpO2 100%   BMI 45.73 kg/m   Physical Exam  Constitutional: She appears well-developed and well-nourished. No distress.  HENT:  Head: Normocephalic and atraumatic.  Eyes: Conjunctivae are normal. Right eye exhibits no discharge. Left eye exhibits no discharge.  Cardiovascular: Normal rate and regular rhythm.  Pulmonary/Chest: Effort normal and breath sounds normal. No respiratory distress.  Abdominal: Soft. She exhibits no distension. There is no tenderness. There is no CVA tenderness.  Musculoskeletal:  Back: No midline tenderness. There is tenderness to palpation to R lateral aspect of upper lumbar paraspinal muscles and lower thoracic paraspinal muscles. Otherwise nontender. No focal/point tenderness.   Neurological: She is alert.  Reflex Scores:      Patellar reflexes are 2+ on the right side and 2+ on the left side. Clear speech. 5/5 grip strength bilaterally. 5/5 strength with plantar/dorsi flexion bilaterally. Sensation grossly intact to bilateral upper/lower extremities. Gait intact.   Psychiatric: She has a normal mood and affect. Her behavior is normal. Thought content normal.  Nursing note and vitals reviewed.   ED Treatments / Results  Labs (all labs ordered are listed, but only abnormal results are displayed) Labs Reviewed - No data to display  EKG  EKG Interpretation None       Radiology No results  found.  Procedures Procedures (including critical care time)  Medications Ordered in ED Medications - No data to display   Initial Impression / Assessment and Plan / ED Course  I have reviewed the triage vital signs and the nursing notes.  Pertinent labs & imaging results that were available during my care of the patient were reviewed by me and considered in my medical decision making (see chart for details).    Patient presents with R sided back/side pain.  She is nontoxic appearing, vitals are WNL. No neurological deficits and normal neuro exam.  Patient is able to ambulate.  No loss of bowel or bladder control.  No concern for cauda equina.  No fever, night sweats, weight loss, h/o cancer, IVDU.  Additionally no urinary symptoms,  no CVA or abdominal tenderness, doubt pyelonephritis or nephrolithiasis. Patient is PERC negative, doubt pulmonary embolism. Suspect muscle related pain. Will give one percocet in the ED. Will discharge home with naproxen and robaxin with PCP follow up- instructed patient she is not to drive or operate heavy machinery while taking robaxin. Will refill tessalon at patient's request as she believes she lost this prescription. I discussed  treatment plan, need for PCP follow-up, and return precautions with the patient. Provided opportunity for questions, patient confirmed understanding and is in agreement with plan.    Final Clinical Impressions(s) / ED Diagnoses   Final diagnoses:  Side pain    ED Discharge Orders        Ordered    naproxen (NAPROSYN) 500 MG tablet  2 times daily     03/30/17 1225    methocarbamol (ROBAXIN) 500 MG tablet  Every 8 hours PRN     03/30/17 1225    benzonatate (TESSALON) 100 MG capsule  Every 8 hours     03/30/17 1225       Petrucelli, Pleas Koch, PA-C 03/30/17 2312    Azalia Bilis, MD 03/31/17 2223

## 2017-03-30 NOTE — ED Notes (Signed)
Bed: WTR6 Expected date:  Expected time:  Means of arrival:  Comments: 

## 2017-11-16 ENCOUNTER — Ambulatory Visit: Payer: Medicaid Other

## 2017-11-17 ENCOUNTER — Ambulatory Visit (INDEPENDENT_AMBULATORY_CARE_PROVIDER_SITE_OTHER): Payer: Medicaid Other

## 2017-11-17 VITALS — BP 109/74 | HR 83 | Resp 16 | Ht 67.0 in | Wt 268.8 lb

## 2017-11-17 DIAGNOSIS — Z3201 Encounter for pregnancy test, result positive: Secondary | ICD-10-CM

## 2017-11-17 DIAGNOSIS — Z3200 Encounter for pregnancy test, result unknown: Secondary | ICD-10-CM

## 2017-11-17 LAB — POCT URINE PREGNANCY: PREG TEST UR: POSITIVE — AB

## 2017-11-17 NOTE — Progress Notes (Signed)
Kristine Hurst presents today for UPT. She has complaints and complains of nausea with vomiting and lightheadedness  Daily. 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   LMP: 09/23/17    OBJECTIVE: Appears well, in no apparent distress.  OB History    Gravida  4   Para  1   Term  1   Preterm  0   AB  2   Living  1     SAB  1   TAB  1   Ectopic  0   Multiple  0   Live Births  1          Home UPT Result: 11/03/17 Positive In-Office UPT result: positive  I have reviewed the patient's medical, obstetrical, social, and family histories, and medications.   ASSESSMENT: Positive pregnancy test  PLAN:  Prenatal care to be completed at: [facility]. Begin Prenatal vitamins Schedule Initial OB visit for 10-12 weeks. If you begin experiencing any shortness of breath, chest pain, acute vaginal bleeding, abdominal pain, go to the nearest ER or [facility] for evaluation and treatment.

## 2017-11-23 NOTE — Progress Notes (Signed)
I have reviewed the chart and agree with nursing staff's documentation of this patient's encounter.  Kenyanna Grzesiak, MD 11/23/2017 10:29 AM    

## 2017-12-10 ENCOUNTER — Encounter: Payer: Self-pay | Admitting: Obstetrics & Gynecology

## 2017-12-10 ENCOUNTER — Ambulatory Visit (INDEPENDENT_AMBULATORY_CARE_PROVIDER_SITE_OTHER): Payer: Medicaid Other | Admitting: Obstetrics & Gynecology

## 2017-12-10 VITALS — BP 110/73 | HR 97 | Wt 267.0 lb

## 2017-12-10 DIAGNOSIS — O99211 Obesity complicating pregnancy, first trimester: Secondary | ICD-10-CM

## 2017-12-10 DIAGNOSIS — Z3480 Encounter for supervision of other normal pregnancy, unspecified trimester: Secondary | ICD-10-CM

## 2017-12-10 DIAGNOSIS — Z3481 Encounter for supervision of other normal pregnancy, first trimester: Secondary | ICD-10-CM

## 2017-12-10 DIAGNOSIS — E669 Obesity, unspecified: Secondary | ICD-10-CM

## 2017-12-10 DIAGNOSIS — O99210 Obesity complicating pregnancy, unspecified trimester: Secondary | ICD-10-CM

## 2017-12-10 NOTE — Patient Instructions (Signed)
First Trimester of Pregnancy The first trimester of pregnancy is from week 1 until the end of week 13 (months 1 through 3). During this time, your baby will begin to develop inside you. At 6-8 weeks, the eyes and face are formed, and the heartbeat can be seen on ultrasound. At the end of 12 weeks, all the baby's organs are formed. Prenatal care is all the medical care you receive before the birth of your baby. Make sure you get good prenatal care and follow all of your doctor's instructions. Follow these instructions at home: Medicines  Take over-the-counter and prescription medicines only as told by your doctor. Some medicines are safe and some medicines are not safe during pregnancy.  Take a prenatal vitamin that contains at least 600 micrograms (mcg) of folic acid.  If you have trouble pooping (constipation), take medicine that will make your stool soft (stool softener) if your doctor approves. Eating and drinking  Eat regular, healthy meals.  Your doctor will tell you the amount of weight gain that is right for you.  Avoid raw meat and uncooked cheese.  If you feel sick to your stomach (nauseous) or throw up (vomit): ? Eat 4 or 5 small meals a day instead of 3 large meals. ? Try eating a few soda crackers. ? Drink liquids between meals instead of during meals.  To prevent constipation: ? Eat foods that are high in fiber, like fresh fruits and vegetables, whole grains, and beans. ? Drink enough fluids to keep your pee (urine) clear or pale yellow. Activity  Exercise only as told by your doctor. Stop exercising if you have cramps or pain in your lower belly (abdomen) or low back.  Do not exercise if it is too hot, too humid, or if you are in a place of great height (high altitude).  Try to avoid standing for long periods of time. Move your legs often if you must stand in one place for a long time.  Avoid heavy lifting.  Wear low-heeled shoes. Sit and stand up straight.  You  can have sex unless your doctor tells you not to. Relieving pain and discomfort  Wear a good support bra if your breasts are sore.  Take warm water baths (sitz baths) to soothe pain or discomfort caused by hemorrhoids. Use hemorrhoid cream if your doctor says it is okay.  Rest with your legs raised if you have leg cramps or low back pain.  If you have puffy, bulging veins (varicose veins) in your legs: ? Wear support hose or compression stockings as told by your doctor. ? Raise (elevate) your feet for 15 minutes, 3-4 times a day. ? Limit salt in your food. Prenatal care  Schedule your prenatal visits by the twelfth week of pregnancy.  Write down your questions. Take them to your prenatal visits.  Keep all your prenatal visits as told by your doctor. This is important. Safety  Wear your seat belt at all times when driving.  Make a list of emergency phone numbers. The list should include numbers for family, friends, the hospital, and police and fire departments. General instructions  Ask your doctor for a referral to a local prenatal class. Begin classes no later than at the start of month 6 of your pregnancy.  Ask for help if you need counseling or if you need help with nutrition. Your doctor can give you advice or tell you where to go for help.  Do not use hot tubs, steam rooms, or   saunas.  Do not douche or use tampons or scented sanitary pads.  Do not cross your legs for long periods of time.  Avoid all herbs and alcohol. Avoid drugs that are not approved by your doctor.  Do not use any tobacco products, including cigarettes, chewing tobacco, and electronic cigarettes. If you need help quitting, ask your doctor. You may get counseling or other support to help you quit.  Avoid cat litter boxes and soil used by cats. These carry germs that can cause birth defects in the baby and can cause a loss of your baby (miscarriage) or stillbirth.  Visit your dentist. At home, brush  your teeth with a soft toothbrush. Be gentle when you floss. Contact a doctor if:  You are dizzy.  You have mild cramps or pressure in your lower belly.  You have a nagging pain in your belly area.  You continue to feel sick to your stomach, you throw up, or you have watery poop (diarrhea).  You have a bad smelling fluid coming from your vagina.  You have pain when you pee (urinate).  You have increased puffiness (swelling) in your face, hands, legs, or ankles. Get help right away if:  You have a fever.  You are leaking fluid from your vagina.  You have spotting or bleeding from your vagina.  You have very bad belly cramping or pain.  You gain or lose weight rapidly.  You throw up blood. It may look like coffee grounds.  You are around people who have German measles, fifth disease, or chickenpox.  You have a very bad headache.  You have shortness of breath.  You have any kind of trauma, such as from a fall or a car accident. Summary  The first trimester of pregnancy is from week 1 until the end of week 13 (months 1 through 3).  To take care of yourself and your unborn baby, you will need to eat healthy meals, take medicines only if your doctor tells you to do so, and do activities that are safe for you and your baby.  Keep all follow-up visits as told by your doctor. This is important as your doctor will have to ensure that your baby is healthy and growing well. This information is not intended to replace advice given to you by your health care provider. Make sure you discuss any questions you have with your health care provider. Document Released: 08/13/2007 Document Revised: 03/04/2016 Document Reviewed: 03/04/2016 Elsevier Interactive Patient Education  2017 Elsevier Inc.  

## 2017-12-10 NOTE — Progress Notes (Signed)
Subjective:   Kristine Hurst is a 22 y.o. [id number] at [redacted]w[redacted]d by LMP being seen today for her first obstetrical visit.  Her obstetrical history is significant for term SVD. Patient does intend to breast feed. Pregnancy history fully reviewed.  Patient reports no complaints.  HISTORY: OB History  Gravida Para Term Preterm AB Living  4 1 1  0 2 1  SAB TAB Ectopic Multiple Live Births  1 1 0 0 1    # Outcome Date GA Lbr Len/2nd Weight Sex Delivery Anes PTL Lv  4 Current           3 Term 04/18/16 [redacted]w[redacted]d 14:59 / 01:13 7 lb 0.5 oz (3.189 kg) M Vag-Spont EPI, Local  LIV     Name: Mcclenney,BOY Donique     Apgar1: 8  Apgar5: 9  2 SAB      SAB     1 TAB      TAB        Birth Comments: System Generated. Please review and update pregnancy details.  Unsure of when she had her last pap smear.  Past Medical History:  Diagnosis Date  . Abnormal CT scan, chest 05/17/2012   CT 01/2012:  1cm calcified density RUL, 4mm calcified nodule LLL, +calcified hilar/mediast nodes.  +spleenic calcifications. + from [place] Neg PPD 12/2011    . Medical history non-contributory   . Seasonal allergies    Past Surgical History:  Procedure Laterality Date  . INDUCED ABORTION  2013   Family History  Problem Relation Age of Onset  . Diabetes Other        Great gradparents paternal  . Diabetes Other        Great gradparents maternal  . Hypertension Maternal Grandmother   . Diabetes Maternal Grandmother   . Hypertension Maternal Grandfather   . Allergies Maternal Grandfather   . Heart attack Maternal Grandfather   . Sleep apnea Maternal Grandfather   . Allergies Brother   . Heart disease Other        great maternal grandparent  . Lung cancer Other        great maternal grandparent  . Pancreatic cancer Other        great maternal grandparent  . Melanoma Other        great maternal grandparent  . Asthma Mother   . Emphysema Other        great grandfather   Social History   Tobacco Use  . Smoking  status: Current Every Day Smoker    Packs/day: 0.20    Years: 3.00    Pack years: 0.60    Types: Cigarettes  . Smokeless tobacco: Never Used  . Tobacco comment: Pt states she is down to 3-4 cigarrettes a day  Substance Use Topics  . Alcohol use: Yes    Alcohol/week: 0.0 standard drinks  . Drug use: No   Allergies  Allergen Reactions  . Cefprozil Hives and Other (See Comments)    Reaction to Cefzil - unknown childhood allergic reaction   Current Outpatient Medications on File Prior to Visit  Medication Sig Dispense Refill  . albuterol (PROVENTIL HFA;VENTOLIN HFA) 108 (90 Base) MCG/ACT inhaler Inhale 2 puffs into the lungs every 6 (six) hours as needed for wheezing or shortness of breath. (Patient not taking: Reported on 12/10/2017) 1 Inhaler 0   No current facility-administered medications on file prior to visit.     Review of Systems Pertinent items noted in HPI and remainder of comprehensive  ROS otherwise negative.  Exam   Vitals:   12/10/17 1328  BP: 110/73  Pulse: 97  Weight: 267 lb (121.1 kg)   Fetal Heart Rate (bpm): 156  General: well-developed, well-nourished female in no acute distress  Breast:  normal appearance, no masses or tenderness  Skin: normal coloration and turgor, no rashes  Neurologic: oriented, normal, negative, normal mood  Extremities: normal strength, tone, and muscle mass, ROM of all joints is normal  HEENT PERRLA, extraocular movement intact and sclera clear, anicteric  Mouth/Teeth mucous membranes moist, pharynx normal without lesions and dental hygiene good  Neck supple and no masses  Cardiovascular: regular rate and rhythm  Respiratory:  no respiratory distress, normal breath sounds  Abdomen: soft, non-tender; bowel sounds normal; no masses,  no organomegaly  Pelvic: deferred    Bedside scan showed SIUP at [redacted]w[redacted]d by CRL  Assessment:   Pregnancy: [id number] Patient Active Problem List   Diagnosis Date Noted  . Supervision of normal  pregnancy 11/08/2015  . Obesity affecting pregnancy, antepartum 11/08/2015     Plan:  Patient has no insurance today yet, does not want labs/pap until next visit.  Continue prenatal vitamins. Genetic Screening discussed, First trimester screen, Quad screen and NIPS: desires NIPS next visit. Ultrasound discussed; fetal anatomic survey: to be ordered later. Problem list reviewed and updated. The nature of [facility] with multiple MDs and other Advanced Practice Providers was explained to patient; also emphasized that residents, students are part of our team. Routine obstetric precautions reviewed. Return in about 4 weeks (around 01/07/2018) for labs, pap, OB Visit.     Jaynie Collins, MD, FACOG Obstetrician & Gynecologist, [facility] [address] for [employer], [facility]

## 2018-01-07 ENCOUNTER — Encounter: Payer: Self-pay | Admitting: Obstetrics & Gynecology

## 2018-01-12 ENCOUNTER — Ambulatory Visit (INDEPENDENT_AMBULATORY_CARE_PROVIDER_SITE_OTHER): Payer: Medicaid Other | Admitting: Obstetrics & Gynecology

## 2018-01-12 ENCOUNTER — Encounter: Payer: Self-pay | Admitting: Obstetrics & Gynecology

## 2018-01-12 ENCOUNTER — Encounter: Payer: Self-pay | Admitting: *Deleted

## 2018-01-12 VITALS — BP 106/71 | HR 81 | Wt 266.0 lb

## 2018-01-12 DIAGNOSIS — Z3482 Encounter for supervision of other normal pregnancy, second trimester: Secondary | ICD-10-CM

## 2018-01-12 DIAGNOSIS — Z3689 Encounter for other specified antenatal screening: Secondary | ICD-10-CM

## 2018-01-12 DIAGNOSIS — O99210 Obesity complicating pregnancy, unspecified trimester: Secondary | ICD-10-CM

## 2018-01-12 MED ORDER — ASPIRIN EC 81 MG PO TBEC: 81.0000 mg | DELAYED_RELEASE_TABLET | Freq: Every day | ORAL | 2 refills | Status: DC

## 2018-01-12 NOTE — Patient Instructions (Signed)
Thank you for enrolling in MyChart. Please follow the instructions below to securely access your online medical record. MyChart allows you to send messages to your doctor, view your test results, manage appointments, and more.   How Do I Sign Up? 1. In your Internet browser, go to [employer] and enter [email or web address]. 2. Click on the Sign Up Now link in the Sign In box. You will see the New Member Sign Up page. 3. Enter your MyChart Access Code exactly as it appears below. You will not need to use this code after you've completed the sign-up process. If you do not sign up before the expiration date, you must request a new code.  MyChart Access Code: [email or web address] Expires: 02/26/2018  3:18 PM  4. Enter your Social Security Number ([email or web address]) and Date of Birth (mm/dd/yyyy) as indicated and click Submit. You will be taken to the next sign-up page. 5. Create a MyChart ID. This will be your MyChart login ID and cannot be changed, so think of one that is secure and easy to remember. 6. Create a MyChart password. You can change your password at any time. 7. Enter your Password Reset Question and Answer. This can be used at a later time if you forget your password.  8. Enter your e-mail address. You will receive e-mail notification when new information is available in MyChart. 9. Click Sign Up. You can now view your medical record.   Additional Information Remember, MyChart is NOT to be used for urgent needs. For medical emergencies, dial 911.   Second Trimester of Pregnancy The second trimester is from week 14 through week 27 (months 4 through 6). The second trimester is often a time when you feel your best. Your body has adjusted to being pregnant, and you begin to feel better physically. Usually, morning sickness has lessened or quit completely, you may have more energy, and you may have an increase in appetite. The second trimester is also a time when the fetus is growing  rapidly. At the end of the sixth month, the fetus is about 9 inches long and weighs about 1 pounds. You will likely begin to feel the baby move (quickening) between 16 and 20 weeks of pregnancy. Body changes during your second trimester Your body continues to go through many changes during your second trimester. The changes vary from woman to woman.  Your weight will continue to increase. You will notice your lower abdomen bulging out.  You may begin to get stretch marks on your hips, abdomen, and breasts.  You may develop headaches that can be relieved by medicines. The medicines should be approved by your health care provider.  You may urinate more often because the fetus is pressing on your bladder.  You may develop or continue to have heartburn as a result of your pregnancy.  You may develop constipation because certain hormones are causing the muscles that push waste through your intestines to slow down.  You may develop hemorrhoids or swollen, bulging veins (varicose veins).  You may have back pain. This is caused by: ? Weight gain. ? Pregnancy hormones that are relaxing the joints in your pelvis. ? A shift in weight and the muscles that support your balance.  Your breasts will continue to grow and they will continue to become tender.  Your gums may bleed and may be sensitive to brushing and flossing.  Dark spots or blotches (chloasma, mask of pregnancy) may develop on your face. This will likely fade after the  baby is born.  A dark line from your belly button to the pubic area (linea nigra) may appear. This will likely fade after the baby is born.  You may have changes in your hair. These can include thickening of your hair, rapid growth, and changes in texture. Some women also have hair loss during or after pregnancy, or hair that feels dry or thin. Your hair will most likely return to normal after your baby is born.  What to expect at prenatal visits During a routine  prenatal visit:  You will be weighed to make sure you and the fetus are growing normally.  Your blood pressure will be taken.  Your abdomen will be measured to track your baby's growth.  The fetal heartbeat will be listened to.  Any test results from the previous visit will be discussed.  Your health care provider may ask you:  How you are feeling.  If you are feeling the baby move.  If you have had any abnormal symptoms, such as leaking fluid, bleeding, severe headaches, or abdominal cramping.  If you are using any tobacco products, including cigarettes, chewing tobacco, and electronic cigarettes.  If you have any questions.  Other tests that may be performed during your second trimester include:  Blood tests that check for: ? Low iron levels (anemia). ? High blood sugar that affects pregnant women (gestational diabetes) between 54 and 28 weeks. ? Rh antibodies. This is to check for a protein on red blood cells (Rh factor).  Urine tests to check for infections, diabetes, or protein in the urine.  An ultrasound to confirm the proper growth and development of the baby.  An amniocentesis to check for possible genetic problems.  Fetal screens for spina bifida and Down syndrome.  HIV (human immunodeficiency virus) testing. Routine prenatal testing includes screening for HIV, unless you choose not to have this test.  Follow these instructions at home: Medicines  Follow your health care provider's instructions regarding medicine use. Specific medicines may be either safe or unsafe to take during pregnancy.  Take a prenatal vitamin that contains at least 600 micrograms (mcg) of folic acid.  If you develop constipation, try taking a stool softener if your health care provider approves. Eating and drinking  Eat a balanced diet that includes fresh fruits and vegetables, whole grains, good sources of protein such as meat, eggs, or tofu, and low-fat dairy. Your health care  provider will help you determine the amount of weight gain that is right for you.  Avoid raw meat and uncooked cheese. These carry germs that can cause birth defects in the baby.  If you have low calcium intake from food, talk to your health care provider about whether you should take a daily calcium supplement.  Limit foods that are high in fat and processed sugars, such as fried and sweet foods.  To prevent constipation: ? Drink enough fluid to keep your urine clear or pale yellow. ? Eat foods that are high in fiber, such as fresh fruits and vegetables, whole grains, and beans. Activity  Exercise only as directed by your health care provider. Most women can continue their usual exercise routine during pregnancy. Try to exercise for 30 minutes at least 5 days a week. Stop exercising if you experience uterine contractions.  Avoid heavy lifting, wear low heel shoes, and practice good posture.  A sexual relationship may be continued unless your health care provider directs you otherwise. Relieving pain and discomfort  Wear a good support  bra to prevent discomfort from breast tenderness.  Take warm sitz baths to soothe any pain or discomfort caused by hemorrhoids. Use hemorrhoid cream if your health care provider approves.  Rest with your legs elevated if you have leg cramps or low back pain.  If you develop varicose veins, wear support hose. Elevate your feet for 15 minutes, 3-4 times a day. Limit salt in your diet. Prenatal Care  Write down your questions. Take them to your prenatal visits.  Keep all your prenatal visits as told by your health care provider. This is important. Safety  Wear your seat belt at all times when driving.  Make a list of emergency phone numbers, including numbers for family, friends, the hospital, and police and fire departments. General instructions  Ask your health care provider for a referral to a local prenatal education class. Begin classes no  later than the beginning of month 6 of your pregnancy.  Ask for help if you have counseling or nutritional needs during pregnancy. Your health care provider can offer advice or refer you to specialists for help with various needs.  Do not use hot tubs, steam rooms, or saunas.  Do not douche or use tampons or scented sanitary pads.  Do not cross your legs for long periods of time.  Avoid cat litter boxes and soil used by cats. These carry germs that can cause birth defects in the baby and possibly loss of the fetus by miscarriage or stillbirth.  Avoid all smoking, herbs, alcohol, and unprescribed drugs. Chemicals in these products can affect the formation and growth of the baby.  Do not use any products that contain nicotine or tobacco, such as cigarettes and e-cigarettes. If you need help quitting, ask your health care provider.  Visit your dentist if you have not gone yet during your pregnancy. Use a soft toothbrush to brush your teeth and be gentle when you floss. Contact a health care provider if:  You have dizziness.  You have mild pelvic cramps, pelvic pressure, or nagging pain in the abdominal area.  You have persistent nausea, vomiting, or diarrhea.  You have a bad smelling vaginal discharge.  You have pain when you urinate. Get help right away if:  You have a fever.  You are leaking fluid from your vagina.  You have spotting or bleeding from your vagina.  You have severe abdominal cramping or pain.  You have rapid weight gain or weight loss.  You have shortness of breath with chest pain.  You notice sudden or extreme swelling of your face, hands, ankles, feet, or legs.  You have not felt your baby move in over an hour.  You have severe headaches that do not go away when you take medicine.  You have vision changes. Summary  The second trimester is from week 14 through week 27 (months 4 through 6). It is also a time when the fetus is growing rapidly.  Your  body goes through many changes during pregnancy. The changes vary from woman to woman.  Avoid all smoking, herbs, alcohol, and unprescribed drugs. These chemicals affect the formation and growth your baby.  Do not use any tobacco products, such as cigarettes, chewing tobacco, and e-cigarettes. If you need help quitting, ask your health care provider.  Contact your health care provider if you have any questions. Keep all prenatal visits as told by your health care provider. This is important. This information is not intended to replace advice given to you by your health  care provider. Make sure you discuss any questions you have with your health care provider. Document Released: 02/18/2001 Document Revised: 04/01/2016 Document Reviewed: 04/01/2016 Elsevier Interactive Patient Education  [employer].

## 2018-01-12 NOTE — Progress Notes (Signed)
   PRENATAL VISIT NOTE  Subjective:  Kristine Hurst is a 22 y.o. [phone] at [redacted]w[redacted]d being seen today for ongoing prenatal care.  She is currently monitored for the following issues for this low-risk pregnancy and has Supervision of normal pregnancy and Maternal morbid obesity, antepartum (HCC) on their problem list.  Patient reports no complaints.  Contractions: Not present. Vag. Bleeding: None.   . Denies leaking of fluid.   The following portions of the patient's history were reviewed and updated as appropriate: allergies, current medications, past family history, past medical history, past social history, past surgical history and problem list. Problem list updated.  Objective:   Vitals:   01/12/18 1450  BP: 106/71  Pulse: 81  Weight: 266 lb (120.7 kg)    Fetal Status: Fetal Heart Rate (bpm): 155         General:  Alert, oriented and cooperative. Patient is in no acute distress.  Skin: Skin is warm and dry. No rash noted.   Cardiovascular: Normal heart rate noted  Respiratory: Normal respiratory effort, no problems with respiration noted  Abdomen: Soft, gravid, appropriate for gestational age.  Pain/Pressure: Present     Pelvic: Cervix  Dilation: Closed Effacement (%): Thick Station: Ballotable. Pap smear was done.  Extremities: Normal range of motion.     Mental Status: Normal mood and affect. Normal behavior. Normal judgment and thought content.   Assessment and Plan:  Pregnancy: G4P1021 at [redacted]w[redacted]d  1. Maternal morbid obesity, antepartum (HCC) Baseline labs done today. Nutrition referral done.  - TSH - Comprehensive metabolic panel - Protein / creatinine ratio, urine - aspirin EC 81 MG tablet; Take 1 tablet (81 mg total) by mouth daily. Take after 12 weeks for prevention of preeclampsia later in pregnancy  Dispense: 300 tablet; Refill: 2 - Referral to Nutrition and Diabetes Services - [place] MFM OB DETAIL +14 WK; Future  2. Encounter for fetal anatomic survey Anatomy scan  ordered - [place] MFM OB DETAIL +14 WK; Future  3. Encounter for supervision of other normal pregnancy in second trimester Prenatal labs, Obesity baseline labs, AFP and NIPS ordered - Genetic Screening - AFP, Serum, Open Spina Bifida - Hemoglobin A1c - Obstetric Panel, Including HIV - Culture, OB Urine - Cytology - PAP - SMN1 COPY NUMBER ANALYSIS (SMA Carrier Screen) - Cystic Fibrosis Mutation 97 - Hemoglobinopathy evaluation - [place] MFM OB DETAIL +14 WK; Future No other complaints or concerns.  Routine obstetric precautions reviewed. Please refer to After Visit Summary for other counseling recommendations.   Return in about 4 weeks (around 02/09/2018) for OB Visit.   Jaynie Collins, MD

## 2018-01-13 LAB — OBSTETRIC PANEL, INCLUDING HIV
ANTIBODY SCREEN: NEGATIVE
BASOS ABS: 0 10*3/uL (ref 0.0–0.2)
BASOS: 0 %
EOS (ABSOLUTE): 0.1 10*3/uL (ref 0.0–0.4)
EOS: 1 %
HEMATOCRIT: 37.3 % (ref 34.0–46.6)
HEMOGLOBIN: 12.6 g/dL (ref 11.1–15.9)
HEP B S AG: NEGATIVE
HIV SCREEN 4TH GENERATION: NONREACTIVE
Immature Grans (Abs): 0 10*3/uL (ref 0.0–0.1)
Immature Granulocytes: 0 %
LYMPHS: 19 %
Lymphocytes Absolute: 1.9 10*3/uL (ref 0.7–3.1)
MCH: 31 pg (ref 26.6–33.0)
MCHC: 33.8 g/dL (ref 31.5–35.7)
MCV: 92 fL (ref 79–97)
MONOCYTES: 6 %
MONOS ABS: 0.6 10*3/uL (ref 0.1–0.9)
NEUTROS PCT: 74 %
Neutrophils Absolute: 7.3 10*3/uL — ABNORMAL HIGH (ref 1.4–7.0)
Platelets: 305 10*3/uL (ref 150–450)
RBC: 4.06 x10E6/uL (ref 3.77–5.28)
RDW: 12.6 % (ref 12.3–15.4)
RPR: NONREACTIVE
RUBELLA: 1.48 {index} (ref >0.99)
Rh Factor: POSITIVE
WBC: 10 10*3/uL (ref 3.4–10.8)

## 2018-01-13 LAB — COMPREHENSIVE METABOLIC PANEL
A/G RATIO: 2 (ref 1.2–2.2)
ALBUMIN: 4 g/dL (ref 3.5–5.5)
ALK PHOS: 53 IU/L (ref 39–117)
ALT: 16 IU/L (ref 0–32)
AST: 14 IU/L (ref 0–40)
BUN / CREAT RATIO: 13 (ref 9–23)
BUN: 7 mg/dL (ref 6–20)
Bilirubin Total: <0.2 mg/dL (ref 0.0–1.2)
CALCIUM: 9 mg/dL (ref 8.7–10.2)
CO2: 17 mmol/L — ABNORMAL LOW (ref 20–29)
Chloride: 108 mmol/L — ABNORMAL HIGH (ref 96–106)
Creatinine, Ser: 0.52 mg/dL — ABNORMAL LOW (ref 0.57–1.00)
GFR calc Af Amer: 155 mL/min/{1.73_m2} (ref >59)
GFR, EST NON AFRICAN AMERICAN: 134 mL/min/{1.73_m2} (ref >59)
GLOBULIN, TOTAL: 2 g/dL (ref 1.5–4.5)
Glucose: 97 mg/dL (ref 65–99)
POTASSIUM: 4 mmol/L (ref 3.5–5.2)
SODIUM: 139 mmol/L (ref 134–144)
Total Protein: 6 g/dL (ref 6.0–8.5)

## 2018-01-13 LAB — PROTEIN / CREATININE RATIO, URINE
Creatinine, Urine: 287.8 mg/dL
PROTEIN UR: 14.9 mg/dL
Protein/Creat Ratio: 52 mg/g creat (ref 0–200)

## 2018-01-13 LAB — HEMOGLOBIN A1C
ESTIMATED AVERAGE GLUCOSE: 100 mg/dL
HEMOGLOBIN A1C: 5.1 % (ref 4.8–5.6)

## 2018-01-13 LAB — TSH: TSH: 2.02 u[IU]/mL (ref 0.450–4.500)

## 2018-01-14 LAB — CYTOLOGY - PAP
CHLAMYDIA, DNA PROBE: NEGATIVE
Diagnosis: NEGATIVE
NEISSERIA GONORRHEA: NEGATIVE

## 2018-01-14 LAB — URINE CULTURE, OB REFLEX

## 2018-01-14 LAB — CULTURE, OB URINE

## 2018-01-19 ENCOUNTER — Encounter: Payer: Self-pay | Admitting: Radiology

## 2018-01-20 LAB — SMN1 COPY NUMBER ANALYSIS (SMA CARRIER SCREENING)

## 2018-01-20 LAB — AFP, SERUM, OPEN SPINA BIFIDA
AFP MoM: 0.97
AFP VALUE AFPOSL: 22.6 ng/mL
Gest. Age on Collection Date: 15.9 weeks
Maternal Age At EDD: 25 yr
OSBR RISK 1 IN: 10000
Test Results:: NEGATIVE
WEIGHT: 266 [lb_av]

## 2018-01-20 LAB — HEMOGLOBINOPATHY EVALUATION
HEMOGLOBIN A2 QUANTITATION: 2.4 % (ref 1.8–3.2)
HEMOGLOBIN F QUANTITATION: 0 % (ref 0.0–2.0)
HGB C: 0 %
HGB S: 0 %
HGB VARIANT: 0 %
Hgb A: 97.6 % (ref 96.4–98.8)

## 2018-01-20 LAB — CYSTIC FIBROSIS MUTATION 97: Interpretation: NOT DETECTED

## 2018-01-25 ENCOUNTER — Encounter (HOSPITAL_COMMUNITY): Payer: Self-pay

## 2018-02-03 ENCOUNTER — Ambulatory Visit (HOSPITAL_COMMUNITY)
Admission: RE | Admit: 2018-02-03 | Discharge: 2018-02-03 | Disposition: A | Discharge status: Home / Self Care | Payer: Medicaid Other | Source: Ambulatory Visit | Attending: Obstetrics & Gynecology | Admitting: Obstetrics & Gynecology

## 2018-02-03 ENCOUNTER — Encounter (HOSPITAL_COMMUNITY): Payer: Self-pay

## 2018-02-03 DIAGNOSIS — Z3482 Encounter for supervision of other normal pregnancy, second trimester: Secondary | ICD-10-CM

## 2018-02-03 DIAGNOSIS — Z363 Encounter for antenatal screening for malformations: Secondary | ICD-10-CM | POA: Diagnosis not present

## 2018-02-03 DIAGNOSIS — Z3689 Encounter for other specified antenatal screening: Secondary | ICD-10-CM | POA: Diagnosis not present

## 2018-02-03 DIAGNOSIS — O4442 Low lying placenta NOS or without hemorrhage, second trimester: Secondary | ICD-10-CM | POA: Diagnosis not present

## 2018-02-03 DIAGNOSIS — O99210 Obesity complicating pregnancy, unspecified trimester: Secondary | ICD-10-CM

## 2018-02-03 DIAGNOSIS — Z3A17 17 weeks gestation of pregnancy: Secondary | ICD-10-CM | POA: Insufficient documentation

## 2018-02-03 DIAGNOSIS — O99212 Obesity complicating pregnancy, second trimester: Secondary | ICD-10-CM | POA: Insufficient documentation

## 2018-02-05 ENCOUNTER — Encounter: Payer: Self-pay | Admitting: Obstetrics & Gynecology

## 2018-02-05 DIAGNOSIS — O4442 Low lying placenta NOS or without hemorrhage, second trimester: Secondary | ICD-10-CM | POA: Insufficient documentation

## 2018-02-08 ENCOUNTER — Other Ambulatory Visit (HOSPITAL_COMMUNITY): Payer: Self-pay | Admitting: *Deleted

## 2018-02-08 DIAGNOSIS — Z362 Encounter for other antenatal screening follow-up: Secondary | ICD-10-CM

## 2018-02-09 ENCOUNTER — Encounter: Payer: Medicaid Other | Admitting: Obstetrics & Gynecology

## 2018-02-09 NOTE — Progress Notes (Deleted)
   Patient did not show up today for her scheduled appointment.   Ledford Goodson, MD, FACOG Obstetrician & Gynecologist, [facility], [facility]  

## 2018-03-10 NOTE — L&D Delivery Note (Signed)
LABOR COURSE Patient was admitted for SROM at 0100 today followed by SOL. Her labor progressed without augmentation.  Delivery Note Called to room and patient was complete and pushing. Head delivered LOA. Loose nuchal cord present, reduced without difficulty. Shoulder and body delivered in usual fashion. At 0935 a viable female was delivered via Vaginal, Spontaneous (Presentation:LOT ; LOA ).  Infant with spontaneous cry, placed on mother's abdomen, dried and stimulated. Cord clamped x 2 after two-minute delay, and cut by patient's mother. Cord blood drawn. Placenta delivered spontaneously with gentle cord traction. Fundus firm with massage and Pitocin. Labia, perineum, vagina, and cervix inspected inspected with no lacterations.    APGAR:9 ,9 ; weight 3650g.   Placenta status: intact ,delivered with gentle cord traction and maternal effort.  Cord:three vessels with the following complications: loose nuchal easily reduced .  Cord pH: not collected  Anesthesia:  Epidural Episiotomy: None Lacerations: None Est. Blood Loss (mL): 150  Mom to postpartum.  Baby to Couplet care / Skin to Skin.  Clayton Bibles, CNM 06/25/18  11:05 AM

## 2018-03-22 ENCOUNTER — Telehealth: Payer: Self-pay | Admitting: Radiology

## 2018-03-22 NOTE — Telephone Encounter (Signed)
Left message for patient to call the office, need to see patient has not been seen in a few months

## 2018-03-23 ENCOUNTER — Ambulatory Visit (INDEPENDENT_AMBULATORY_CARE_PROVIDER_SITE_OTHER): Payer: Medicaid Other | Admitting: Obstetrics and Gynecology

## 2018-03-23 VITALS — BP 114/75 | HR 87 | Wt 274.0 lb

## 2018-03-23 DIAGNOSIS — O4442 Low lying placenta NOS or without hemorrhage, second trimester: Secondary | ICD-10-CM

## 2018-03-23 DIAGNOSIS — Z3482 Encounter for supervision of other normal pregnancy, second trimester: Secondary | ICD-10-CM

## 2018-03-23 NOTE — Progress Notes (Signed)
Having issue with teeth Would also like to discuss [facility] options for postpartum

## 2018-03-23 NOTE — Progress Notes (Signed)
Prenatal Visit Note Date: 03/23/2018 Clinic: [facility]  Subjective:  Kristine Hurst is a 23 y.o. [phone] at [redacted]w[redacted]d being seen today for ongoing prenatal care.  She is currently monitored for the following issues for this high-risk pregnancy and has Supervision of normal pregnancy; Maternal morbid obesity, antepartum (HCC); and Low-lying placenta in second trimester on their problem list.  Patient reports no complaints.   Contractions: Not present. Vag. Bleeding: None.  Movement: Present. Denies leaking of fluid.   The following portions of the patient's history were reviewed and updated as appropriate: allergies, current medications, past family history, past medical history, past social history, past surgical history and problem list. Problem list updated.  Objective:   Vitals:   03/23/18 1429  BP: 114/75  Pulse: 87  Weight: 274 lb (124.3 kg)    Fetal Status: Fetal Heart Rate (bpm): 144   Movement: Present     General:  Alert, oriented and cooperative. Patient is in no acute distress.  Skin: Skin is warm and dry. No rash noted.   Cardiovascular: Normal heart rate noted  Respiratory: Normal respiratory effort, no problems with respiration noted  Abdomen: Soft, gravid, appropriate for gestational age. Pain/Pressure: Absent     Pelvic:  Cervical exam deferred        Extremities: Normal range of motion.     Mental Status: Normal mood and affect. Normal behavior. Normal judgment and thought content.   Urinalysis:      Assessment and Plan:  Pregnancy: G4P1021 at [redacted]w[redacted]d  1. Encounter for supervision of other normal pregnancy in second trimester Routine care. Weight gain good. 2h GTT nv. Loss in care due to insurance issues. D/w her to keep appts and we can work out any insurance issues later. List of dentists given to patient  2. Low-lying placenta in second trimester F/u rpt scan later this month. Ob triage precautions given  Preterm labor symptoms and  general obstetric precautions including but not limited to vaginal bleeding, contractions, leaking of fluid and fetal movement were reviewed in detail with the patient. Please refer to After Visit Summary for other counseling recommendations.  Return in about 3 weeks (around 04/13/2018) for rob and 2h GTT.   Brooks Bing, MD

## 2018-03-30 ENCOUNTER — Encounter: Payer: Self-pay | Admitting: Radiology

## 2018-03-31 ENCOUNTER — Encounter (HOSPITAL_COMMUNITY): Payer: Self-pay

## 2018-03-31 ENCOUNTER — Ambulatory Visit (HOSPITAL_COMMUNITY)
Admission: RE | Admit: 2018-03-31 | Discharge: 2018-03-31 | Disposition: A | Discharge status: Home / Self Care | Payer: Medicaid Other | Source: Ambulatory Visit | Attending: Obstetrics & Gynecology | Admitting: Obstetrics & Gynecology

## 2018-03-31 DIAGNOSIS — Z362 Encounter for other antenatal screening follow-up: Secondary | ICD-10-CM | POA: Diagnosis not present

## 2018-03-31 DIAGNOSIS — O99212 Obesity complicating pregnancy, second trimester: Secondary | ICD-10-CM

## 2018-03-31 DIAGNOSIS — O4442 Low lying placenta NOS or without hemorrhage, second trimester: Secondary | ICD-10-CM | POA: Diagnosis not present

## 2018-03-31 DIAGNOSIS — Z3A25 25 weeks gestation of pregnancy: Secondary | ICD-10-CM | POA: Diagnosis not present

## 2018-04-01 ENCOUNTER — Other Ambulatory Visit (HOSPITAL_COMMUNITY): Payer: Self-pay | Admitting: *Deleted

## 2018-04-01 DIAGNOSIS — O99213 Obesity complicating pregnancy, third trimester: Secondary | ICD-10-CM

## 2018-04-13 ENCOUNTER — Ambulatory Visit (INDEPENDENT_AMBULATORY_CARE_PROVIDER_SITE_OTHER): Payer: Medicaid Other | Admitting: Obstetrics & Gynecology

## 2018-04-13 ENCOUNTER — Encounter: Payer: Self-pay | Admitting: *Deleted

## 2018-04-13 ENCOUNTER — Other Ambulatory Visit: Payer: Medicaid Other

## 2018-04-13 VITALS — BP 104/71 | HR 90 | Wt 275.0 lb

## 2018-04-13 DIAGNOSIS — Z3482 Encounter for supervision of other normal pregnancy, second trimester: Secondary | ICD-10-CM | POA: Diagnosis not present

## 2018-04-13 DIAGNOSIS — O99212 Obesity complicating pregnancy, second trimester: Secondary | ICD-10-CM

## 2018-04-13 DIAGNOSIS — Z23 Encounter for immunization: Secondary | ICD-10-CM | POA: Diagnosis not present

## 2018-04-13 DIAGNOSIS — O99210 Obesity complicating pregnancy, unspecified trimester: Principal | ICD-10-CM

## 2018-04-13 NOTE — Progress Notes (Signed)
   PRENATAL VISIT NOTE  Subjective:  Kristine Hurst is a 23 y.o. [phone] at [redacted]w[redacted]d being seen today for ongoing prenatal care.  She is currently monitored for the following issues for this low-risk pregnancy and has Supervision of normal pregnancy and Maternal morbid obesity, antepartum (HCC) on their problem list.  Patient reports no complaints.  Contractions: Irritability. Vag. Bleeding: None.  Movement: Present. Denies leaking of fluid.   The following portions of the patient's history were reviewed and updated as appropriate: allergies, current medications, past family history, past medical history, past social history, past surgical history and problem list. Problem list updated.  Objective:   Vitals:   04/13/18 0947  BP: 104/71  Pulse: 90  Weight: 275 lb (124.7 kg)    Fetal Status: Fetal Heart Rate (bpm): 161   Movement: Present     General:  Alert, oriented and cooperative. Patient is in no acute distress.  Skin: Skin is warm and dry. No rash noted.   Cardiovascular: Normal heart rate noted  Respiratory: Normal respiratory effort, no problems with respiration noted  Abdomen: Soft, gravid, appropriate for gestational age.  Pain/Pressure: Absent     Pelvic: Cervical exam deferred        Extremities: Normal range of motion.  Edema: None  Mental Status: Normal mood and affect. Normal behavior. Normal judgment and thought content.   Assessment and Plan:  Pregnancy: G4P1021 at [redacted]w[redacted]d  1. Encounter for supervision of other normal pregnancy in second trimester - 28 week labs - TDAP today  2. Maternal morbid obesity, antepartum (HCC) - rec watch weight gain  Preterm labor symptoms and general obstetric precautions including but not limited to vaginal bleeding, contractions, leaking of fluid and fetal movement were reviewed in detail with the patient. Please refer to After Visit Summary for other counseling recommendations.  Return in about 3 weeks (around 05/04/2018).  Future  Appointments  Date Time Provider Department Center  05/13/2018  3:00 PM [facility] [place] 3 [facility]    Allie Bossier, MD

## 2018-04-14 LAB — GLUCOSE TOLERANCE, 2 HOURS W/ 1HR
Glucose, 1 hour: 118 mg/dL (ref 65–179)
Glucose, 2 hour: 92 mg/dL (ref 65–152)
Glucose, Fasting: 72 mg/dL (ref 65–91)

## 2018-04-14 LAB — CBC
HEMOGLOBIN: 11.8 g/dL (ref 11.1–15.9)
Hematocrit: 35.7 % (ref 34.0–46.6)
MCH: 32.2 pg (ref 26.6–33.0)
MCHC: 33.1 g/dL (ref 31.5–35.7)
MCV: 97 fL (ref 79–97)
Platelets: 296 10*3/uL (ref 150–450)
RBC: 3.67 x10E6/uL — ABNORMAL LOW (ref 3.77–5.28)
RDW: 12.8 % (ref 11.7–15.4)
WBC: 12.9 10*3/uL — ABNORMAL HIGH (ref 3.4–10.8)

## 2018-04-14 LAB — HIV ANTIBODY (ROUTINE TESTING W REFLEX): HIV SCREEN 4TH GENERATION: NONREACTIVE

## 2018-04-14 LAB — RPR: RPR Ser Ql: NONREACTIVE

## 2018-05-04 ENCOUNTER — Encounter: Payer: Medicaid Other | Admitting: Obstetrics & Gynecology

## 2018-05-13 ENCOUNTER — Ambulatory Visit (HOSPITAL_COMMUNITY): Admission: RE | Admit: 2018-05-13 | Payer: Medicaid Other | Source: Ambulatory Visit

## 2018-05-13 ENCOUNTER — Ambulatory Visit (HOSPITAL_COMMUNITY): Payer: Medicaid Other | Attending: Obstetrics and Gynecology

## 2018-05-30 ENCOUNTER — Encounter (HOSPITAL_COMMUNITY): Payer: Self-pay

## 2018-05-30 ENCOUNTER — Other Ambulatory Visit: Payer: Self-pay

## 2018-05-30 ENCOUNTER — Inpatient Hospital Stay (HOSPITAL_COMMUNITY)
Admission: EM | Admit: 2018-05-30 | Discharge: 2018-05-30 | Disposition: A | Discharge status: Home / Self Care | Payer: Medicaid Other | Attending: Family Medicine | Admitting: Family Medicine

## 2018-05-30 DIAGNOSIS — R102 Pelvic and perineal pain: Secondary | ICD-10-CM | POA: Diagnosis not present

## 2018-05-30 DIAGNOSIS — S334XXA Traumatic rupture of symphysis pubis, initial encounter: Secondary | ICD-10-CM | POA: Diagnosis not present

## 2018-05-30 DIAGNOSIS — F17210 Nicotine dependence, cigarettes, uncomplicated: Secondary | ICD-10-CM | POA: Diagnosis not present

## 2018-05-30 DIAGNOSIS — O9989 Other specified diseases and conditions complicating pregnancy, childbirth and the puerperium: Secondary | ICD-10-CM

## 2018-05-30 DIAGNOSIS — M549 Dorsalgia, unspecified: Secondary | ICD-10-CM | POA: Diagnosis not present

## 2018-05-30 DIAGNOSIS — O26713 Subluxation of symphysis (pubis) in pregnancy, third trimester: Secondary | ICD-10-CM | POA: Insufficient documentation

## 2018-05-30 DIAGNOSIS — Z3A34 34 weeks gestation of pregnancy: Secondary | ICD-10-CM | POA: Diagnosis not present

## 2018-05-30 DIAGNOSIS — O26893 Other specified pregnancy related conditions, third trimester: Secondary | ICD-10-CM | POA: Insufficient documentation

## 2018-05-30 DIAGNOSIS — O99891 Other specified diseases and conditions complicating pregnancy: Secondary | ICD-10-CM

## 2018-05-30 LAB — URINALYSIS, ROUTINE W REFLEX MICROSCOPIC
Bilirubin Urine: NEGATIVE
Glucose, UA: NEGATIVE mg/dL
Hgb urine dipstick: NEGATIVE
Ketones, ur: NEGATIVE mg/dL
Nitrite: NEGATIVE
PROTEIN: 30 mg/dL — AB
Specific Gravity, Urine: 1.019 (ref 1.005–1.030)
pH: 6 (ref 5.0–8.0)

## 2018-05-30 LAB — WET PREP, GENITAL
Clue Cells Wet Prep HPF POC: NONE SEEN
Sperm: NONE SEEN
Trich, Wet Prep: NONE SEEN
Yeast Wet Prep HPF POC: NONE SEEN

## 2018-05-30 NOTE — Discharge Instructions (Signed)

## 2018-05-30 NOTE — MAU Note (Signed)
Kristine Hurst is a 23 y.o. at [redacted]w[redacted]d here in MAU reporting: contractions since Thursday evening, states they are not consistent. No bleeding, no LOF. + FM  Onset of complaint: Thursday evening  Pain score: 7/10  Vitals:   05/30/18 1142  BP: (!) 85/40  Pulse: (!) 107  Resp: 18  Temp: 98.2 F (36.8 C)  SpO2: 97%      Lab orders placed from triage: UA

## 2018-05-30 NOTE — MAU Provider Note (Signed)
History     CSN: [id number]  Arrival date and time: 05/30/18 1103   First Provider Initiated Contact with Patient 05/30/18 1154      Chief Complaint  Patient presents with  . Contractions  . Pelvic Pain   Kristine Hurst is a 23 y.o G4P1021 at [redacted]w[redacted]d who presents today with contractions. Last OB visit was 04/11/18. Patient missed her visit on 05/04/18, and has not called to reschedule.   Pelvic Pain  The patient's primary symptoms include pelvic pain. This is a new problem. The current episode started in the past 7 days. The problem occurs intermittently. The problem has been unchanged. Pain severity now: 7/10. The problem affects both sides. She is pregnant. The vaginal discharge was normal. There has been no bleeding. Nothing aggravates the symptoms. She has tried nothing for the symptoms. Sexual activity: denies intercourse in the last 24 hours.     OB History    Gravida  4   Para  1   Term  1   Preterm  0   AB  2   Living  1     SAB  1   TAB  1   Ectopic  0   Multiple  0   Live Births  1           Past Medical History:  Diagnosis Date  . Abnormal CT scan, chest 05/17/2012   CT 01/2012:  1cm calcified density RUL, 63mm calcified nodule LLL, +calcified hilar/mediast nodes.  +spleenic calcifications. + from [place] Neg PPD 12/2011    . Medical history non-contributory   . Seasonal allergies     Past Surgical History:  Procedure Laterality Date  . INDUCED ABORTION  2013    Family History  Problem Relation Age of Onset  . Diabetes Other        Great gradparents paternal  . Diabetes Other        Great gradparents maternal  . Hypertension Maternal Grandmother   . Diabetes Maternal Grandmother   . Hypertension Maternal Grandfather   . Allergies Maternal Grandfather   . Heart attack Maternal Grandfather   . Sleep apnea Maternal Grandfather   . Allergies Brother   . Heart disease Other        great maternal grandparent  . Lung cancer Other    great maternal grandparent  . Pancreatic cancer Other        great maternal grandparent  . Melanoma Other        great maternal grandparent  . Asthma Mother   . Emphysema Other        great grandfather    Social History   Tobacco Use  . Smoking status: Current Every Day Smoker    Packs/day: 0.20    Years: 3.00    Pack years: 0.60    Types: Cigarettes  . Smokeless tobacco: Never Used  . Tobacco comment: 1 cig per day  Substance Use Topics  . Alcohol use: Yes    Alcohol/week: 0.0 standard drinks  . Drug use: No    Allergies:  Allergies  Allergen Reactions  . Cefprozil Hives and Other (See Comments)    Reaction to Cefzil - unknown childhood allergic reaction    Medications Prior to Admission  Medication Sig Dispense Refill Last Dose  . albuterol (PROVENTIL HFA;VENTOLIN HFA) 108 (90 Base) MCG/ACT inhaler Inhale 2 puffs into the lungs every 6 (six) hours as needed for wheezing or shortness of breath. (Patient not taking: Reported on 12/10/2017) 1  Inhaler 0 Not Taking  . aspirin EC 81 MG tablet Take 1 tablet (81 mg total) by mouth daily. Take after 12 weeks for prevention of preeclampsia later in pregnancy (Patient not taking: Reported on 03/23/2018) 300 tablet 2 Not Taking  . Prenatal Multivit-Min-Fe-FA (PRENATAL VITAMINS PO) Take by mouth.   Taking    Review of Systems  Genitourinary: Positive for pelvic pain.   Physical Exam   Blood pressure (!) 85/40, pulse (!) 107, temperature 98.2 F (36.8 C), temperature source Oral, resp. rate 18, height 5\' 7"  (1.702 m), weight 127.6 kg, last menstrual period 09/23/2017, SpO2 97 %.  Physical Exam  Nursing note and vitals reviewed. Constitutional: She is oriented to person, place, and time. She appears well-developed and well-nourished. No distress.  HENT:  Head: Normocephalic.  Cardiovascular: Normal rate.  Respiratory: Effort normal.  GI: Soft. There is no abdominal tenderness. There is no rebound.  Neurological: She is alert  and oriented to person, place, and time.  Skin: Skin is warm and dry.  Psychiatric: She has a normal mood and affect.    Dilation: 1 Effacement (%): Thick Station: -2 Presentation: Vertex Exam by:: Jillien Yakel, cnm  NST:  Baseline: 135 Variability: moderate Accels: 15x15 Decels: none Toco: none  Results for orders placed or performed during the hospital encounter of 05/30/18 (from the past 24 hour(s))  Urinalysis, Routine w reflex microscopic     Status: Abnormal   Collection Time: 05/30/18 11:42 AM  Result Value Ref Range   Color, Urine YELLOW YELLOW   APPearance HAZY (A) CLEAR   Specific Gravity, Urine 1.019 1.005 - 1.030   pH 6.0 5.0 - 8.0   Glucose, UA NEGATIVE NEGATIVE mg/dL   Hgb urine dipstick NEGATIVE NEGATIVE   Bilirubin Urine NEGATIVE NEGATIVE   Ketones, ur NEGATIVE NEGATIVE mg/dL   Protein, ur 30 (A) NEGATIVE mg/dL   Nitrite NEGATIVE NEGATIVE   Leukocytes,Ua SMALL (A) NEGATIVE   RBC / HPF 0-5 0 - 5 RBC/hpf   WBC, UA 6-10 0 - 5 WBC/hpf   Bacteria, UA FEW (A) NONE SEEN   Squamous Epithelial / LPF 0-5 0 - 5   Mucus PRESENT   Wet prep, genital     Status: Abnormal   Collection Time: 05/30/18 12:39 PM  Result Value Ref Range   Yeast Wet Prep HPF POC NONE SEEN NONE SEEN   Trich, Wet Prep NONE SEEN NONE SEEN   Clue Cells Wet Prep HPF POC NONE SEEN NONE SEEN   WBC, Wet Prep HPF POC FEW (A) NONE SEEN   Sperm NONE SEEN     MAU Course  Procedures  MDM No cervical change after 1 hour. No contractions tracing on Toco.  Assessment and Plan   1. Back pain affecting pregnancy in third trimester   2. Dislocation of symphysis pubis, initial encounter   3. [redacted] weeks gestation of pregnancy    DC home Comfort measures reviewed  3rd Trimester precautions  PTL precautions  Fetal kick counts RX: none  Return to MAU as needed FU with OB as planned  Follow-up Information    [facility] Follow up.   Specialty:  Obstetrics and  Gynecology Contact information: [address] [place] [address] [phone]         Thressa Sheller DNP, CNM  05/30/18  1:46 PM

## 2018-05-31 LAB — GC/CHLAMYDIA PROBE AMP (~~LOC~~) NOT AT ARMC
Chlamydia: NEGATIVE
NEISSERIA GONORRHEA: NEGATIVE

## 2018-06-10 ENCOUNTER — Telehealth: Payer: Self-pay | Admitting: Radiology

## 2018-06-10 NOTE — Telephone Encounter (Signed)
Left message for pt to call [facility] to schedule 36 WK ROB visit @ [facility]

## 2018-06-17 ENCOUNTER — Ambulatory Visit (INDEPENDENT_AMBULATORY_CARE_PROVIDER_SITE_OTHER): Payer: Medicaid Other | Admitting: Obstetrics and Gynecology

## 2018-06-17 ENCOUNTER — Other Ambulatory Visit: Payer: Self-pay

## 2018-06-17 VITALS — BP 108/74 | HR 72 | Wt 290.8 lb

## 2018-06-17 DIAGNOSIS — Z3A37 37 weeks gestation of pregnancy: Secondary | ICD-10-CM

## 2018-06-17 DIAGNOSIS — Z3483 Encounter for supervision of other normal pregnancy, third trimester: Secondary | ICD-10-CM

## 2018-06-17 NOTE — Progress Notes (Signed)
Prenatal Visit Note Date: 06/17/2018 Clinic: [facility]  Subjective:  Kristine Hurst is a 23 y.o. [phone] at [redacted]w[redacted]d being seen today for ongoing prenatal care.  She is currently monitored for the following issues for this high-risk pregnancy and has Supervision of normal pregnancy and Maternal morbid obesity, antepartum (HCC) on their problem list.  Patient reports no complaints.   Contractions: Not present. Vag. Bleeding: None.  Movement: Present. Denies leaking of fluid.   The following portions of the patient's history were reviewed and updated as appropriate: allergies, current medications, past family history, past medical history, past social history, past surgical history and problem list. Problem list updated.  Objective:   Vitals:   06/17/18 1040  BP: 108/74  Pulse: 72  Weight: 290 lb 12.8 oz (131.9 kg)    Fetal Status: Fetal Heart Rate (bpm): 131 Fundal Height: 37 cm Movement: Present  Presentation: Vertex  General:  Alert, oriented and cooperative. Patient is in no acute distress.  Skin: Skin is warm and dry. No rash noted.   Cardiovascular: Normal heart rate noted  Respiratory: Normal respiratory effort, no problems with respiration noted  Abdomen: Soft, gravid, appropriate for gestational age. Pain/Pressure: Present     Pelvic:  Cervical exam performed Dilation: Closed Effacement (%): 50 Station: -2  Extremities: Normal range of motion.  Edema: None  Mental Status: Normal mood and affect. Normal behavior. Normal judgment and thought content.   Urinalysis:      Assessment and Plan:  Pregnancy: G4P1021 at [redacted]w[redacted]d  1. Encounter for supervision of other normal pregnancy in third trimester Routine care. Has growth next week (due to bmi). bp cuff given. BC options d/w pt. ?depo vs iud - Strep Gp B Culture+Rflx - Enroll Patient in Babyscripts - Babyscripts Schedule Optimization  Term labor symptoms and general obstetric precautions including but not  limited to vaginal bleeding, contractions, leaking of fluid and fetal movement were reviewed in detail with the patient. Please refer to After Visit Summary for other counseling recommendations.  Return in about 2 weeks (around 07/01/2018) for rob.   [place] Bing, MD

## 2018-06-21 LAB — STREP GP B CULTURE+RFLX: Strep Gp B Culture+Rflx: NEGATIVE

## 2018-06-24 ENCOUNTER — Other Ambulatory Visit: Payer: Self-pay

## 2018-06-24 ENCOUNTER — Ambulatory Visit (HOSPITAL_BASED_OUTPATIENT_CLINIC_OR_DEPARTMENT_OTHER)
Admission: RE | Admit: 2018-06-24 | Discharge: 2018-06-24 | Disposition: A | Discharge status: Home / Self Care | Payer: Medicaid Other | Source: Ambulatory Visit | Attending: Obstetrics and Gynecology | Admitting: Obstetrics and Gynecology

## 2018-06-24 ENCOUNTER — Ambulatory Visit (HOSPITAL_COMMUNITY): Payer: Medicaid Other | Admitting: *Deleted

## 2018-06-24 ENCOUNTER — Encounter (HOSPITAL_COMMUNITY): Payer: Self-pay

## 2018-06-24 VITALS — BP 108/70 | HR 96 | Temp 98.7°F

## 2018-06-24 DIAGNOSIS — Z362 Encounter for other antenatal screening follow-up: Secondary | ICD-10-CM

## 2018-06-24 DIAGNOSIS — Z3A38 38 weeks gestation of pregnancy: Secondary | ICD-10-CM

## 2018-06-24 DIAGNOSIS — O99213 Obesity complicating pregnancy, third trimester: Secondary | ICD-10-CM | POA: Insufficient documentation

## 2018-06-25 ENCOUNTER — Inpatient Hospital Stay (HOSPITAL_COMMUNITY)
Admission: AD | Admit: 2018-06-25 | Discharge: 2018-06-26 | DRG: 807 | Disposition: A | Discharge status: Home / Self Care | Payer: Medicaid Other | Attending: Family Medicine | Admitting: Family Medicine

## 2018-06-25 ENCOUNTER — Encounter (HOSPITAL_COMMUNITY): Payer: Self-pay | Admitting: Anesthesiology

## 2018-06-25 ENCOUNTER — Inpatient Hospital Stay (HOSPITAL_COMMUNITY): Payer: Medicaid Other | Admitting: Anesthesiology

## 2018-06-25 ENCOUNTER — Other Ambulatory Visit: Payer: Self-pay

## 2018-06-25 DIAGNOSIS — F17210 Nicotine dependence, cigarettes, uncomplicated: Secondary | ICD-10-CM | POA: Diagnosis present

## 2018-06-25 DIAGNOSIS — O99334 Smoking (tobacco) complicating childbirth: Secondary | ICD-10-CM | POA: Diagnosis present

## 2018-06-25 DIAGNOSIS — O99214 Obesity complicating childbirth: Secondary | ICD-10-CM

## 2018-06-25 DIAGNOSIS — O26893 Other specified pregnancy related conditions, third trimester: Secondary | ICD-10-CM | POA: Diagnosis present

## 2018-06-25 DIAGNOSIS — Z3A38 38 weeks gestation of pregnancy: Secondary | ICD-10-CM

## 2018-06-25 LAB — CBC
HCT: 37.7 % (ref 36.0–46.0)
Hemoglobin: 12 g/dL (ref 12.0–15.0)
MCH: 31.5 pg (ref 26.0–34.0)
MCHC: 31.8 g/dL (ref 30.0–36.0)
MCV: 99 fL (ref 80.0–100.0)
Platelets: 276 10*3/uL (ref 150–400)
RBC: 3.81 MIL/uL — ABNORMAL LOW (ref 3.87–5.11)
RDW: 13.4 % (ref 11.5–15.5)
WBC: 15.2 10*3/uL — ABNORMAL HIGH (ref 4.0–10.5)
nRBC: 0 % (ref 0.0–0.2)

## 2018-06-25 LAB — TYPE AND SCREEN
ABO/RH(D): O POS
Antibody Screen: NEGATIVE

## 2018-06-25 LAB — RPR: RPR Ser Ql: NONREACTIVE

## 2018-06-25 MED ORDER — OXYTOCIN BOLUS FROM INFUSION
500.0000 mL | Freq: Once | INTRAVENOUS | Status: AC
  Administered 2018-06-25: 500 mL via INTRAVENOUS

## 2018-06-25 MED ORDER — DIPHENHYDRAMINE HCL 50 MG/ML IJ SOLN: 12.5000 mg | INTRAMUSCULAR | Status: DC | PRN

## 2018-06-25 MED ORDER — PHENYLEPHRINE 40 MCG/ML (10ML) SYRINGE FOR IV PUSH (FOR BLOOD PRESSURE SUPPORT)
80.0000 ug | PREFILLED_SYRINGE | INTRAVENOUS | Status: DC | PRN
  Administered 2018-06-25: 04:00:00 80 ug via INTRAVENOUS

## 2018-06-25 MED ORDER — MAGNESIUM HYDROXIDE 400 MG/5ML PO SUSP: 30.0000 mL | ORAL | Status: DC | PRN

## 2018-06-25 MED ORDER — SIMETHICONE 80 MG PO CHEW: 80.0000 mg | CHEWABLE_TABLET | ORAL | Status: DC | PRN

## 2018-06-25 MED ORDER — PHENYLEPHRINE 40 MCG/ML (10ML) SYRINGE FOR IV PUSH (FOR BLOOD PRESSURE SUPPORT): 80.0000 ug | PREFILLED_SYRINGE | INTRAVENOUS | Status: DC | PRN

## 2018-06-25 MED ORDER — IBUPROFEN 600 MG PO TABS
600.0000 mg | ORAL_TABLET | Freq: Four times a day (QID) | ORAL | Status: DC
  Administered 2018-06-25 – 2018-06-26 (×5): 600 mg via ORAL
  Filled 2018-06-25 (×5): qty 1

## 2018-06-25 MED ORDER — LACTATED RINGERS IV SOLN
INTRAVENOUS | Status: DC
  Administered 2018-06-25 (×2): via INTRAVENOUS

## 2018-06-25 MED ORDER — EPHEDRINE 5 MG/ML INJ
INTRAVENOUS | Status: AC
  Filled 2018-06-25: qty 10

## 2018-06-25 MED ORDER — SODIUM CHLORIDE (PF) 0.9 % IJ SOLN
INTRAMUSCULAR | Status: DC | PRN
  Administered 2018-06-25: 12 mL/h via EPIDURAL

## 2018-06-25 MED ORDER — OXYCODONE-ACETAMINOPHEN 5-325 MG PO TABS
2.0000 | ORAL_TABLET | ORAL | Status: DC | PRN
  Administered 2018-06-25 – 2018-06-26 (×2): 2 via ORAL
  Filled 2018-06-25 (×2): qty 2

## 2018-06-25 MED ORDER — LACTATED RINGERS IV SOLN: 500.0000 mL | Freq: Once | INTRAVENOUS | Status: DC

## 2018-06-25 MED ORDER — PHENYLEPHRINE 40 MCG/ML (10ML) SYRINGE FOR IV PUSH (FOR BLOOD PRESSURE SUPPORT)
PREFILLED_SYRINGE | INTRAVENOUS | Status: AC
  Administered 2018-06-25: 04:00:00 80 ug via INTRAVENOUS
  Filled 2018-06-25: qty 10

## 2018-06-25 MED ORDER — ACETAMINOPHEN 325 MG PO TABS
650.0000 mg | ORAL_TABLET | ORAL | Status: DC | PRN
  Administered 2018-06-25: 15:00:00 650 mg via ORAL
  Filled 2018-06-25: qty 2

## 2018-06-25 MED ORDER — ONDANSETRON HCL 4 MG/2ML IJ SOLN: 4.0000 mg | INTRAMUSCULAR | Status: DC | PRN

## 2018-06-25 MED ORDER — LACTATED RINGERS IV SOLN: 500.0000 mL | INTRAVENOUS | Status: DC | PRN

## 2018-06-25 MED ORDER — OXYCODONE-ACETAMINOPHEN 5-325 MG PO TABS
1.0000 | ORAL_TABLET | ORAL | Status: DC | PRN
  Administered 2018-06-25: 18:00:00 1 via ORAL
  Filled 2018-06-25: qty 1

## 2018-06-25 MED ORDER — FENTANYL-BUPIVACAINE-NACL 0.5-0.125-0.9 MG/250ML-% EP SOLN
EPIDURAL | Status: AC
  Filled 2018-06-25: qty 250

## 2018-06-25 MED ORDER — SOD CITRATE-CITRIC ACID 500-334 MG/5ML PO SOLN: 30.0000 mL | ORAL | Status: DC | PRN

## 2018-06-25 MED ORDER — WITCH HAZEL-GLYCERIN EX PADS: 1.0000 "application " | MEDICATED_PAD | CUTANEOUS | Status: DC | PRN

## 2018-06-25 MED ORDER — PHENYLEPHRINE 40 MCG/ML (10ML) SYRINGE FOR IV PUSH (FOR BLOOD PRESSURE SUPPORT)
PREFILLED_SYRINGE | INTRAVENOUS | Status: AC
  Administered 2018-06-25: 04:00:00 80 ug
  Filled 2018-06-25: qty 10

## 2018-06-25 MED ORDER — EPHEDRINE 5 MG/ML INJ: 10.0000 mg | INTRAVENOUS | Status: DC | PRN

## 2018-06-25 MED ORDER — LIDOCAINE HCL (PF) 1 % IJ SOLN
30.0000 mL | INTRAMUSCULAR | Status: DC | PRN
  Filled 2018-06-25: qty 30

## 2018-06-25 MED ORDER — ONDANSETRON HCL 4 MG PO TABS: 4.0000 mg | ORAL_TABLET | ORAL | Status: DC | PRN

## 2018-06-25 MED ORDER — FERROUS SULFATE 325 (65 FE) MG PO TABS
325.0000 mg | ORAL_TABLET | Freq: Two times a day (BID) | ORAL | Status: DC
  Administered 2018-06-25 – 2018-06-26 (×2): 325 mg via ORAL
  Filled 2018-06-25 (×2): qty 1

## 2018-06-25 MED ORDER — OXYTOCIN 40 UNITS IN NORMAL SALINE INFUSION - SIMPLE MED
2.5000 [IU]/h | INTRAVENOUS | Status: DC
  Filled 2018-06-25: qty 1000

## 2018-06-25 MED ORDER — LIDOCAINE HCL (PF) 1 % IJ SOLN
INTRAMUSCULAR | Status: DC | PRN
  Administered 2018-06-25 (×2): 6 mL via EPIDURAL

## 2018-06-25 MED ORDER — ONDANSETRON HCL 4 MG/2ML IJ SOLN: 4.0000 mg | Freq: Four times a day (QID) | INTRAMUSCULAR | Status: DC | PRN

## 2018-06-25 MED ORDER — ACETAMINOPHEN 325 MG PO TABS: 650.0000 mg | ORAL_TABLET | ORAL | Status: DC | PRN

## 2018-06-25 MED ORDER — FENTANYL-BUPIVACAINE-NACL 0.5-0.125-0.9 MG/250ML-% EP SOLN: 12.0000 mL/h | EPIDURAL | Status: DC | PRN

## 2018-06-25 MED ORDER — COCONUT OIL OIL: 1.0000 "application " | TOPICAL_OIL | Status: DC | PRN

## 2018-06-25 MED ORDER — FENTANYL CITRATE (PF) 100 MCG/2ML IJ SOLN: 100.0000 ug | INTRAMUSCULAR | Status: DC | PRN

## 2018-06-25 MED ORDER — DIBUCAINE (PERIANAL) 1 % EX OINT: 1.0000 "application " | TOPICAL_OINTMENT | CUTANEOUS | Status: DC | PRN

## 2018-06-25 MED ORDER — DIPHENHYDRAMINE HCL 25 MG PO CAPS: 25.0000 mg | ORAL_CAPSULE | Freq: Four times a day (QID) | ORAL | Status: DC | PRN

## 2018-06-25 MED ORDER — PRENATAL MULTIVITAMIN CH
1.0000 | ORAL_TABLET | Freq: Every day | ORAL | Status: DC
  Administered 2018-06-25 – 2018-06-26 (×2): 1 via ORAL
  Filled 2018-06-25 (×2): qty 1

## 2018-06-25 MED ORDER — BENZOCAINE-MENTHOL 20-0.5 % EX AERO
1.0000 "application " | INHALATION_SPRAY | CUTANEOUS | Status: DC | PRN
  Administered 2018-06-25: 1 via TOPICAL
  Filled 2018-06-25: qty 56

## 2018-06-25 NOTE — H&P (Signed)
LABOR AND DELIVERY ADMISSION HISTORY AND PHYSICAL NOTE  Kristine Hurst is a 23 y.o. female [phone] with IUP at [redacted]w[redacted]d by 1st trimester [place] presenting for SROM at 1am. She reports positive fetal movement. She denies leakage of fluid or vaginal bleeding.  Prenatal History/Complications: PNC at [facility] Pregnancy complications:  - Obesity  Past Medical History: Past Medical History:  Diagnosis Date  . Abnormal CT scan, chest 05/17/2012   CT 01/2012:  1cm calcified density RUL, 69mm calcified nodule LLL, +calcified hilar/mediast nodes.  +spleenic calcifications. + from [place] Neg PPD 12/2011    . Medical history non-contributory   . Seasonal allergies     Past Surgical History: Past Surgical History:  Procedure Laterality Date  . INDUCED ABORTION  2013    Obstetrical History: OB History    Gravida  4   Para  1   Term  1   Preterm  0   AB  2   Living  1     SAB  1   TAB  1   Ectopic  0   Multiple  0   Live Births  1           Social History: Social History   Socioeconomic History  . Marital status: Single    Spouse name: Not on file  . Number of children: Not on file  . Years of education: Not on file  . Highest education level: Not on file  Occupational History  . Occupation: Conservation officer, [employer]    Comment: [employer]  Social Needs  . Financial resource strain: Not on file  . Food insecurity:    Worry: Not on file    Inability: Not on file  . Transportation needs:    Medical: Not on file    Non-medical: Not on file  Tobacco Use  . Smoking status: Current Every Day Smoker    Packs/day: 0.20    Years: 3.00    Pack years: 0.60    Types: Cigarettes  . Smokeless tobacco: Never Used  . Tobacco comment: 1 cig per day  Substance and Sexual Activity  . Alcohol use: Yes    Alcohol/week: 0.0 standard drinks  . Drug use: No  . Sexual activity: Yes    Birth control/protection: None  Lifestyle  . Physical activity:    Days per week: Not on file     Minutes per session: Not on file  . Stress: Not on file  Relationships  . Social connections:    Talks on phone: Not on file    Gets together: Not on file    Attends religious service: Not on file    Active member of club or organization: Not on file    Attends meetings of clubs or organizations: Not on file    Relationship status: Not on file  Other Topics Concern  . Not on file  Social History Narrative  . Not on file    Family History: Family History  Problem Relation Age of Onset  . Diabetes Other        Great gradparents paternal  . Diabetes Other        Great gradparents maternal  . Hypertension Maternal Grandmother   . Diabetes Maternal Grandmother   . Hypertension Maternal Grandfather   . Allergies Maternal Grandfather   . Heart attack Maternal Grandfather   . Sleep apnea Maternal Grandfather   . Allergies Brother   . Heart disease Other        great maternal  grandparent  . Lung cancer Other        great maternal grandparent  . Pancreatic cancer Other        great maternal grandparent  . Melanoma Other        great maternal grandparent  . Asthma Mother   . Emphysema Other        great grandfather    Allergies: Allergies  Allergen Reactions  . Cefprozil Hives and Other (See Comments)    Reaction to Cefzil - unknown childhood allergic reaction    Medications Prior to Admission  Medication Sig Dispense Refill Last Dose  . albuterol (PROVENTIL HFA;VENTOLIN HFA) 108 (90 Base) MCG/ACT inhaler Inhale 2 puffs into the lungs every 6 (six) hours as needed for wheezing or shortness of breath. (Patient not taking: Reported on 12/10/2017) 1 Inhaler 0 Not Taking  . aspirin EC 81 MG tablet Take 1 tablet (81 mg total) by mouth daily. Take after 12 weeks for prevention of preeclampsia later in pregnancy (Patient not taking: Reported on 03/23/2018) 300 tablet 2 Not Taking  . Prenatal Multivit-Min-Fe-FA (PRENATAL VITAMINS PO) Take by mouth.   Taking     Review of Systems   All systems reviewed and negative except as stated in HPI  Physical Exam Blood pressure (!) 131/56, pulse 94, temperature 98.1 F (36.7 C), resp. rate 20, last menstrual period 09/23/2017. General appearance: alert, oriented, pain with contractions Lungs: normal respiratory effort Heart: regular rate Abdomen: soft, non-tender; gravid, FH appropriate for GA Extremities: No calf swelling or tenderness Presentation: cephalic Fetal monitoring: baseline 140/mod var/+ acels/no decels Uterine activity: contractions every 7311m    Prenatal labs: ABO, Rh: O/Positive/-- (11/05 1545) Antibody: Negative (11/05 1545) Rubella: 1.48 (11/05 1545) RPR: Non Reactive (02/04 0936)  HBsAg: Negative (11/05 1545)  HIV: Non Reactive (02/04 0936)  GC/Chlamydia: negative GBS:   negative 2-hr GTT: 72/118/92 Genetic screening:  Low risk Anatomy US: normal  Prenatal Transfer Tool  Maternal Diabetes: No Genetic Screening: Normal Maternal Ultrasounds/Referrals: Normal Fetal Ultrasounds or other Referrals:  None Maternal Substance Abuse:  No Significant Maternal Medications:  None Significant Maternal Lab Results: None  No results found for this or any previous visit (from the past 24 hour(s)).  Patient Active Problem List   Diagnosis Date Noted  . Supervision of normal pregnancy 11/08/2015  . Maternal morbid obesity, antepartum (HCC) 11/08/2015    Assessment: Kristine Hurst is a 23 y.o. G4P1021 at 8035w1d here for SROM  #Labor: regular painful contractions. Will start pitocin with next check if contractions space or minimal to no cervical change #Pain: Epidural in place #FWB: Cat 1 #ID:  GBS (-) #MOF: Breast #MOC: Depo #Circ:  Yes/undecided on location  Xylah Early,MD 06/25/2018, 2:22 AM

## 2018-06-25 NOTE — MAU Note (Signed)
CTX every 3-5 mins SROM at 0100-clear fluid. + FM.  Some spotting.

## 2018-06-25 NOTE — Anesthesia Postprocedure Evaluation (Signed)
Anesthesia Post Note  Patient: Kristine Hurst  Procedure(s) Performed: AN AD HOC LABOR EPIDURAL     Patient location during evaluation: Mother Baby Anesthesia Type: Epidural Level of consciousness: awake and alert Pain management: pain level controlled Vital Signs Assessment: post-procedure vital signs reviewed and stable Respiratory status: spontaneous breathing, nonlabored ventilation and respiratory function stable Cardiovascular status: stable Postop Assessment: no headache, no backache and epidural receding Anesthetic complications: no Comments: Per telephone conversation    Last Vitals:  Vitals:   06/25/18 1630 06/25/18 2026  BP: (!) 107/53 (!) 99/49  Pulse: 72 77  Resp: 18 18  Temp: 36.8 C 36.8 C  SpO2: 98% 97%    Last Pain:  Vitals:   06/25/18 2031  TempSrc:   PainSc: 0-No pain   Pain Goal: Patients Stated Pain Goal: 3 (06/25/18 1754)              Epidural/Spinal Function Cutaneous sensation: Normal sensation (06/25/18 2031), Patient able to flex knees: Yes (06/25/18 2031), Patient able to lift hips off bed: Yes (06/25/18 2031), Back pain beyond tenderness at insertion site: No (06/25/18 2031), Progressively worsening motor and/or sensory loss: No (06/25/18 2031), Bowel and/or bladder incontinence post epidural: No (06/25/18 2031)  [name] N

## 2018-06-25 NOTE — Anesthesia Preprocedure Evaluation (Signed)
Anesthesia Evaluation  Patient identified by MRN, date of birth, ID band Patient awake    Reviewed: Allergy & Precautions, H&P , NPO status , Patient's Chart, lab work & pertinent test results  Airway Mallampati: II  TM Distance: >3 FB Neck ROM: full    Dental no notable dental hx. (+) Teeth Intact   Pulmonary Current Smoker,    Pulmonary exam normal breath sounds clear to auscultation       Cardiovascular negative cardio ROS   Rhythm:regular Rate:Normal     Neuro/Psych negative neurological ROS  negative psych ROS   GI/Hepatic negative GI ROS, Neg liver ROS,   Endo/Other  Morbid obesity  Renal/GU negative Renal ROS  negative genitourinary   Musculoskeletal negative musculoskeletal ROS (+)   Abdominal (+) + obese,   Peds  Hematology negative hematology ROS (+)   Anesthesia Other Findings   Reproductive/Obstetrics (+) Pregnancy                             Anesthesia Physical Anesthesia Plan  ASA: III  Anesthesia Plan: Epidural   Post-op Pain Management:    Induction:   PONV Risk Score and Plan:   Airway Management Planned:   Additional Equipment:   Intra-op Plan:   Post-operative Plan:   Informed Consent: I have reviewed the patients History and Physical, chart, labs and discussed the procedure including the risks, benefits and alternatives for the proposed anesthesia with the patient or authorized representative who has indicated his/her understanding and acceptance.       Plan Discussed with:   Anesthesia Plan Comments:         Anesthesia Quick Evaluation

## 2018-06-25 NOTE — Discharge Summary (Signed)
Obstetrics Discharge Summary OB/[facility]N Faculty Practice   Patient Name: Kristine Hurst DOB: 10/28/1995 MRN: [id number]  Date of admission: 06/25/2018 Delivering MD: Calvert CantorWEINHOLD, SAMANTHA C   Date of discharge: 06/26/2018  Admitting diagnosis: 38.1WKS WATER BROKE Intrauterine pregnancy: 5945w1d     Secondary diagnosis:   Principal Problem:   SVD (spontaneous vaginal delivery) Active Problems:   Labor and delivery indication for care or intervention   Additional problems:  . None     Discharge diagnosis: Term Pregnancy Delivered                                            Postpartum procedures: None  Complications: None  Outpatient Follow-Up: Televisit in 4-6 weeks  Hospital course: Kristine Hurst is a 23 y.o. 7145w1d who was admitted for SROM with SOL. Her pregnancy was complicated by Maternal morbid obesity. Her labor course was unremarkable. Delivery was complicated by loose nuchal cord easily reduced by CNM. Please see delivery note for additional details.   Her postpartum course was uncomplicated. She was desiring to breastfeed, but had not intitated it yet. She was passing flatus, urinating, eating and drinking without difficulty.She requested and received Depo Provera.  Her pain was controlled with ibuprofen and percocet and she was discharged home with her newborn. She will follow-up with a virtual visit in 4-6 weeks.   Physical exam  Vitals:   06/25/18 1220 06/25/18 1630 06/25/18 2026 06/26/18 0538  BP: (!) 103/56 (!) 107/53 (!) 99/49 99/65  Pulse: 83 72 77 72  Resp: 18 18 18 16   Temp: 98.3 F (36.8 C) 98.3 F (36.8 C) 98.2 F (36.8 C) 98.1 F (36.7 C)  TempSrc: Oral Oral Oral Oral  SpO2: 98% 98% 97% 99%   General: Appears well, No Distress, Morbidly Obese Chest: Lungs CTA, Heart RRR Abdomen: Soft, Appropriately Tender, BS Present Lochia: appropriate Uterine Fundus: firm at umbilicus Incision: N/A DVT Evaluation: No evidence of DVT seen on physical exam. Calf/Ankle  edema is present  Labs: Lab Results  Component Value Date   WBC 15.2 (H) 06/25/2018   HGB 12.0 06/25/2018   HCT 37.7 06/25/2018   MCV 99.0 06/25/2018   PLT 276 06/25/2018   CMP Latest Ref Rng & Units 01/12/2018  Glucose 65 - 99 mg/dL 97  BUN 6 - 20 mg/dL 7  Creatinine 4.090.57 - 8.111.00 mg/dL 9.14(N0.52(L)  Sodium 829134 - 562144 mmol/L 139  Potassium 3.5 - 5.2 mmol/L 4.0  Chloride 96 - 106 mmol/L 108(H)  CO2 20 - 29 mmol/L 17(L)  Calcium 8.7 - 10.2 mg/dL 9.0  Total Protein 6.0 - 8.5 g/dL 6.0  Total Bilirubin 0.0 - 1.2 mg/dL <1.3<0.2  Alkaline Phos 39 - 117 IU/L 53  AST 0 - 40 IU/L 14  ALT 0 - 32 IU/L 16    Discharge instructions: Per After Visit Summary and "Baby and Me Booklet" Pain Management, Peri-Care, Breastfeeding, Who and When to call for postpartum complications. Information Sheet(s) given Care after Vaginal Delivery and Depo Provera Fact Sheet.    After visit meds:  Allergies as of 06/26/2018      Reactions   Cefprozil Hives, Other (See Comments)   Reaction to Cefzil - unknown childhood allergic reaction      Medication List    STOP taking these medications   albuterol 108 (90 Base) MCG/ACT inhaler Commonly known as:  VENTOLIN HFA   aspirin  EC 81 MG tablet   calcium carbonate 750 MG chewable tablet Commonly known as:  TUMS EX   PRENATAL VITAMINS PO     TAKE these medications   ibuprofen 800 MG tablet Commonly known as:  ADVIL Take 1 tablet (800 mg total) by mouth every 8 (eight) hours as needed.       Postpartum contraception: Depo Provera prior to discharge Diet: Routine Diet Activity: Advance as tolerated. Pelvic rest for 6 weeks.   Follow-up Appt:  Follow-up Visit:No follow-ups on file.  Newborn Data: Live born female  Birth Weight:   APGAR: 9, 9  Newborn Delivery   Birth date/time:  06/25/2018 09:35:00 Delivery type:  Vaginal, Spontaneous     Baby Feeding: Bottle Disposition:home with mother   Cherre Robins MSN, CNM 06/26/2018 12:02 PM

## 2018-06-25 NOTE — Progress Notes (Signed)
LABOR PROGRESS NOTE  Kristine Hurst is a 23 y.o. [phone] at [redacted]w[redacted]d  admitted for SROM/SOL  Subjective: Comfortable with epidural Sleeping  Objective: BP 94/64   Pulse 67   Temp 98 F (36.7 C) (Oral)   Resp 16   LMP 09/23/2017  or  Vitals:   06/25/18 0617 06/25/18 0632 06/25/18 0709 06/25/18 0717  BP: 93/63 (!) 98/56 90/66 94/64   Pulse: 78 80 (!) 104 67  Resp:    16  Temp:      TempSrc:        Dilation: 7.5 Effacement (%): 90 Station: -1 Presentation: Vertex Exam by:: R. Craft, RN FHT: baseline rate 130, moderate varibility, + acel, no/occasional variable decel Toco: regular q2-85m (at times difficult to trace)  Labs: Lab Results  Component Value Date   WBC 15.2 (H) 06/25/2018   HGB 12.0 06/25/2018   HCT 37.7 06/25/2018   MCV 99.0 06/25/2018   PLT 276 06/25/2018    Patient Active Problem List   Diagnosis Date Noted  . Supervision of normal pregnancy 11/08/2015  . Maternal morbid obesity, antepartum (HCC) 11/08/2015    Assessment / Plan: 23 y.o. [id number] at [redacted]w[redacted]d here for SROM/SOL  Labor: progressing well without augmentation.  Fetal Wellbeing:  Cat 2 (reassuring) Pain Control:  Epidural Anticipated MOD:  SVD  Kristine Kistner,MD OB Fellow  06/25/2018, 8:01 AM

## 2018-06-25 NOTE — Lactation Note (Signed)
This note was copied from a baby's chart. Lactation Consultation Note  Patient Name: Kristine Hurst [id number] Date: 06/25/2018   G2P2 baby Kristine Danne Baxter now 51 hours old. Mom reports She tried in hospital to bf with first.  Didn't go well. Mom reports she has not tried to breastfeed him yet.  Mom has DEBP set up in room.  Inquired about pumping and mom reports she had thought about pumping but hasnt done it yet.  Mom reports she does not have a breastpump for home use.  Let mom know that she has manual pump in her DEBP kit. Reviewed Understanding mother and baby, Reviewed [facility] breastfeeding Consultation [facility], gave handouts. Offered to assist with feeding.  Mom reports he just ate formula.  Encouraged mom to call for breastfeeding assistance. Maternal Data    Feeding Feeding Type: Formula Nipple Type: Slow - flow  LATCH Score                   Interventions    Lactation Tools Discussed/Used     Consult Status      Gumaro Brightbill Thompson Caul 06/25/2018, 4:05 PM

## 2018-06-25 NOTE — Progress Notes (Signed)
Kristine Hurst is a 23 y.o. [phone] at [redacted]w[redacted]d admitted for SROM with SOL Subjective: S/p epidural. Continues to sleep  Objective: BP (!) 93/45   Pulse 73   Temp 98.1 F (36.7 C) (Oral)   Resp 16   LMP 09/23/2017  I/O last 3 completed shifts: In: -  Out: 300 [Urine:300] No intake/output data recorded.  FHT:  FHR: 130 bpm, variability: moderate,  accelerations:  Present,  decelerations:  Present occasional shallow variable with good variability UC:   irregular, every 2-3 minutes SVE:   Dilation: 7.5 Effacement (%): 90 Station: -1 Exam by:: Oletta Darter, RN  Labs: Lab Results  Component Value Date   WBC 15.2 (H) 06/25/2018   HGB 12.0 06/25/2018   HCT 37.7 06/25/2018   MCV 99.0 06/25/2018   PLT 276 06/25/2018   Assessment / Plan: SOL without augmentation Epidural in place Now Category I, rare Cat II Will introduce myself to patient and recheck cervix at 0930 if patient not awake by then Anticipate NSVD  Calvert Cantor, CNM 06/25/2018, 8:55 AM

## 2018-06-25 NOTE — Anesthesia Procedure Notes (Signed)
Epidural Patient location during procedure: OB Start time: 06/25/2018 3:21 AM End time: 06/25/2018 3:25 AM  Staffing Anesthesiologist: Leilani Able, MD Performed: anesthesiologist   Preanesthetic Checklist Completed: patient identified, site marked, surgical consent, pre-op evaluation, timeout performed, IV checked, risks and benefits discussed and monitors and equipment checked  Epidural Patient position: sitting Prep: site prepped and draped and DuraPrep Patient monitoring: continuous pulse ox and blood pressure Approach: midline Location: L3-L4 Injection technique: LOR air  Needle:  Needle type: Tuohy  Needle gauge: 17 G Needle length: 9 cm and 9 Needle insertion depth: 8 cm Catheter type: closed end flexible Catheter size: 19 Gauge Catheter at skin depth: 14 cm Test dose: negative and Other  Assessment Sensory level: T9 Events: blood not aspirated, injection not painful, no injection resistance, negative IV test and no paresthesia  Additional Notes Reason for block:procedure for pain

## 2018-06-26 LAB — ABO/RH: ABO/RH(D): O POS

## 2018-06-26 MED ORDER — MEDROXYPROGESTERONE ACETATE 150 MG/ML IM SUSP
150.0000 mg | Freq: Once | INTRAMUSCULAR | Status: AC
  Administered 2018-06-26: 150 mg via INTRAMUSCULAR
  Filled 2018-06-26: qty 1

## 2018-06-26 MED ORDER — IBUPROFEN 800 MG PO TABS: 800.0000 mg | ORAL_TABLET | Freq: Three times a day (TID) | ORAL | 0 refills | Status: DC | PRN

## 2018-06-26 NOTE — Lactation Note (Signed)
This note was copied from a baby's chart. Lactation Consultation Note  Patient Name: Kristine Hurst [id number] Date: 06/26/2018 Reason for consult: Follow-up assessment;Infant weight loss(5% weight loss, per mom plans to pump and bottle feed - see LC note / P2 )  Baby is 36 hours old.  Per mom plans to pump/ bottle feed / plans to buy a DEBP.  LC instructed mom on the use of hand pump with the DEBP.  Per mom has not pumped since the DEBP was set up.  LC reviewed supply and demand and the importance of  Consistent pumping around the clock at least 8 times in 24 hours, 8-10 times  Would even be better to enhance let down to establish and protect milk supply.  S/S's of mastitis reviewed/ storage of breast milk/ and the benefits of providing  Breast milk for her baby/ sore nipple and engorgement prevention and tx.  LC mentioned to mom  the virtual BFSG / the breast feeding help line and the  The LC O/P by appt.     Maternal Data    Feeding Feeding Type: (mom getting ready to feed the baby )  LATCH Score                   Interventions Interventions: Breast feeding basics reviewed  Lactation Tools Discussed/Used Pump Review: Setup, frequency, and cleaning;Milk Storage Initiated by:: reviewed / MAI  Date initiated:: 06/26/18   Consult Status Consult Status: Complete Date: 06/26/18    [name] 06/26/2018, 12:46 PM

## 2018-06-26 NOTE — Discharge Instructions (Signed)
Postpartum Baby Blues The postpartum period begins right after the birth of a baby. During this time, there is often a lot of joy and excitement. It is also a time of many changes in the life of the parents. No matter how many times a mother gives birth, each child brings new challenges to the family, including different ways of relating to one another. It is common to have feelings of excitement along with confusing changes in moods, emotions, and thoughts. You may feel happy one minute and sad or stressed the next. These feelings of sadness usually happen in the period right after you have your baby, and they go away within a week or two. This is called the "baby blues." What are the causes? There is no known cause of baby blues. It is likely caused by a combination of factors. However, changes in hormone levels after childbirth are believed to trigger some of the symptoms. Other factors that can play a role in these mood changes include:  Lack of sleep.  Stressful life events, such as poverty, caring for a loved one, or death of a loved one.  Genetics. What are the signs or symptoms? Symptoms of this condition include:  Brief changes in mood, such as going from extreme happiness to sadness.  Decreased concentration.  Difficulty sleeping.  Crying spells and tearfulness.  Loss of appetite.  Irritability.  Anxiety. If the symptoms of baby blues last for more than 2 weeks or become more severe, you may have postpartum depression. How is this diagnosed? This condition is diagnosed based on an evaluation of your symptoms. There are no medical or lab tests that lead to a diagnosis, but there are various questionnaires that a health care provider may use to identify women with the baby blues or postpartum depression. How is this treated? Treatment is not needed for this condition. The baby blues usually go away on their own in 1-2 weeks. Social support is often all that is needed. You will  be encouraged to get adequate sleep and rest. Follow these instructions at home: Lifestyle      Get as much rest as you can. Take a nap when the baby sleeps.  Exercise regularly as told by your health care provider. Some women find yoga and walking to be helpful.  Eat a balanced and nourishing diet. This includes plenty of fruits and vegetables, whole grains, and lean proteins.  Do little things that you enjoy. Have a cup of tea, take a bubble bath, read your favorite magazine, or listen to your favorite music.  Avoid alcohol.  Ask for help with household chores, cooking, grocery shopping, or running errands. Do not try to do everything yourself. Consider hiring a postpartum doula to help. This is a professional who specializes in providing support to new mothers.  Try not to make any major life changes during pregnancy or right after giving birth. This can add stress. General instructions  Talk to people close to you about how you are feeling. Get support from your partner, family members, friends, or other new moms. You may want to join a support group.  Find ways to cope with stress. This may include: ? Writing your thoughts and feelings in a journal. ? Spending time outside. ? Spending time with people who make you laugh.  Try to stay positive in how you think. Think about the things you are grateful for.  Take over-the-counter and prescription medicines only as told by your health care provider.    Spending time with people who make you laugh.  · Try to stay positive in how you think. Think about the things you are grateful for.  · Take over-the-counter and prescription medicines only as told by your health care provider.  · Let your health care provider know if you have any concerns.  · Keep all postpartum visits as told by your health care provider. This is important.  Contact a health care provider if:  · Your baby blues do not go away after 2 weeks.  Get help right away if:  · You have thoughts of taking your own life (suicidal thoughts).  · You think you may harm the baby or other people.  · You see or hear things that are not there (hallucinations).  Summary  · After giving birth, you may feel happy  one minute and sad or stressed the next. Feelings of sadness that happen right after the baby is born and go away after a week or two are called the "baby blues."  · You can manage the baby blues by getting enough rest, eating a healthy diet, exercising, spending time with supportive people, and finding ways to cope with stress.  · If feelings of sadness and stress last longer than 2 weeks or get in the way of caring for your baby, talk to your health care provider. This may mean you have postpartum depression.  This information is not intended to replace advice given to you by your health care provider. Make sure you discuss any questions you have with your health care provider.  Document Released: 11/29/2003 Document Revised: 04/22/2016 Document Reviewed: 04/22/2016  Elsevier Interactive Patient Education © 2019 [employer]  Postpartum Care After Vaginal Delivery  This sheet gives you information about how to care for yourself from the time you deliver your baby to up to 6-12 weeks after delivery (postpartum period). Your health care provider may also give you more specific instructions. If you have problems or questions, contact your health care provider.  Follow these instructions at home:  Vaginal bleeding  · It is normal to have vaginal bleeding (lochia) after delivery. Wear a sanitary pad for vaginal bleeding and discharge.  ? During the first week after delivery, the amount and appearance of lochia is often similar to a menstrual period.  ? Over the next few weeks, it will gradually decrease to a dry, yellow-brown discharge.  ? For most women, lochia stops completely by 4-6 weeks after delivery. Vaginal bleeding can vary from woman to woman.  · Change your sanitary pads frequently. Watch for any changes in your flow, such as:  ? A sudden increase in volume.  ? A change in color.  ? Large blood clots.  · If you pass a blood clot from your vagina, save it and call your health care provider to discuss. Do not  flush blood clots down the toilet before talking with your health care provider.  · Do not use tampons or douches until your health care provider says this is safe.  · If you are not breastfeeding, your period should return 6-8 weeks after delivery. If you are feeding your child breast milk only (exclusive breastfeeding), your period may not return until you stop breastfeeding.  Perineal care  · Keep the area between the vagina and the anus (perineum) clean and dry as told by your health care provider. Use medicated pads and pain-relieving sprays and creams as directed.  · If you had a cut in the   perineum (episiotomy) or a tear in the vagina, check the area for signs of infection until you are healed. Check for:  ? More redness, swelling, or pain.  ? Fluid or blood coming from the cut or tear.  ? Warmth.  ? Pus or a bad smell.  · You may be given a squirt bottle to use instead of wiping to clean the perineum area after you go to the bathroom. As you start healing, you may use the squirt bottle before wiping yourself. Make sure to wipe gently.  · To relieve pain caused by an episiotomy, a tear in the vagina, or swollen veins in the anus (hemorrhoids), try taking a warm sitz bath 2-3 times a day. A sitz bath is a warm water bath that is taken while you are sitting down. The water should only come up to your hips and should cover your buttocks.  Breast care  · Within the first few days after delivery, your breasts may feel heavy, full, and uncomfortable (breast engorgement). Milk may also leak from your breasts. Your health care provider can suggest ways to help relieve the discomfort. Breast engorgement should go away within a few days.  · If you are breastfeeding:  ? Wear a bra that supports your breasts and fits you well.  ? Keep your nipples clean and dry. Apply creams and ointments as told by your health care provider.  ? You may need to use breast pads to absorb milk that leaks from your breasts.  ? You may have  uterine contractions every time you breastfeed for up to several weeks after delivery. Uterine contractions help your uterus return to its normal size.  ? If you have any problems with breastfeeding, work with your health care provider or lactation consultant.  · If you are not breastfeeding:  ? Avoid touching your breasts a lot. Doing this can make your breasts produce more milk.  ? Wear a good-fitting bra and use cold packs to help with swelling.  ? Do not squeeze out (express) milk. This causes you to make more milk.  Intimacy and sexuality  · Ask your health care provider when you can engage in sexual activity. This may depend on:  ? Your risk of infection.  ? How fast you are healing.  ? Your comfort and desire to engage in sexual activity.  · You are able to get pregnant after delivery, even if you have not had your period. If desired, talk with your health care provider about methods of birth control (contraception).  Medicines  · Take over-the-counter and prescription medicines only as told by your health care provider.  · If you were prescribed an antibiotic medicine, take it as told by your health care provider. Do not stop taking the antibiotic even if you start to feel better.  Activity  · Gradually return to your normal activities as told by your health care provider. Ask your health care provider what activities are safe for you.  · Rest as much as possible. Try to rest or take a nap while your baby is sleeping.  Eating and drinking    · Drink enough fluid to keep your urine pale yellow.  · Eat high-fiber foods every day. These may help prevent or relieve constipation. High-fiber foods include:  ? Whole grain cereals and breads.  ? Brown rice.  ? Beans.  ? Fresh fruits and vegetables.  · Do not try to lose weight quickly by cutting back on calories.  ·   brings to your life, and these feelings do not go away.  You feel unusually sad or worried.  Your breasts become red, painful, or hard.  You have a fever.  You have trouble holding urine or keeping urine from leaking.  You have little or no interest in activities you used to enjoy.  You have not breastfed at all and you have not had a menstrual period for 12 weeks after delivery.  You have stopped breastfeeding and you have not had a menstrual period for 12 weeks after you stopped breastfeeding.  You have questions about caring for yourself or your baby.  You pass a blood clot from your vagina. Get help right away if:  You have chest pain.  You have difficulty breathing.  You have sudden, severe leg pain.  You have severe pain or cramping in your lower abdomen.  You bleed from your vagina so much that you fill more than one sanitary pad in one hour. Bleeding should not be heavier than your heaviest period.  You develop a severe headache.  You faint.  You have blurred vision or spots in your vision.  You have bad-smelling vaginal discharge.  You have thoughts about hurting yourself or your baby. If you ever feel like you may hurt yourself or others, or have thoughts about taking your own life, get help right away. You can go to the nearest emergency department or call:  Your local emergency services (911 in the [place]  A suicide crisis helpline, such as the  National Suicide Prevention Lifeline at (484)206-07531-(434)561-9964. This is open 24 hours a day. Summary  The period of time right after you deliver your newborn up to 6-12 weeks after delivery is called the postpartum period.  Gradually return to your normal activities as told by your health care provider.  Keep all follow-up visits for you and your baby as told by your health care provider. This information is not intended to replace advice given to you by your health care provider. Make sure you discuss any questions you have with your health care provider. Document Released: 12/22/2006 Document Revised: 12/08/2016 Document Reviewed: 12/08/2016 Elsevier Interactive Patient Education  2019 [employer] Inc. Medroxyprogesterone injection [Contraceptive] What is this medicine? MEDROXYPROGESTERONE (me DROX ee proe JES te rone) contraceptive injections prevent pregnancy. They provide effective birth control for 3 months. Depo-subQ Provera 104 is also used for treating pain related to endometriosis. This medicine may be used for other purposes; ask your health care provider or pharmacist if you have questions. COMMON BRAND NAME(S): Depo-Provera, Depo-subQ Provera 104 What should I tell my health care provider before I take this medicine? They need to know if you have any of these conditions: -frequently drink alcohol -asthma -blood vessel disease or a history of a blood clot in the lungs or legs -bone disease such as osteoporosis -breast cancer -diabetes -eating disorder (anorexia nervosa or bulimia) -high blood pressure -HIV infection or AIDS -kidney disease -liver disease -mental depression -migraine -seizures (convulsions) -stroke -tobacco smoker -vaginal bleeding -an unusual or allergic reaction to medroxyprogesterone, other hormones, medicines, foods, dyes, or preservatives -pregnant or trying to get pregnant -breast-feeding How should I use this medicine? Depo-Provera Contraceptive  injection is given into a muscle. Depo-subQ Provera 104 injection is given under the skin. These injections are given by a health care professional. You must not be pregnant before getting an injection. The injection is usually given during the first 5 days after the start of a menstrual period or 6  weeks after delivery of a baby. Talk to your pediatrician regarding the use of this medicine in children. Special care may be needed. These injections have been used in female children who have started having menstrual periods. Overdosage: If you think you have taken too much of this medicine contact a poison control center or emergency room at once. NOTE: This medicine is only for you. Do not share this medicine with others. What if I miss a dose? Try not to miss a dose. You must get an injection once every 3 months to maintain birth control. If you cannot keep an appointment, call and reschedule it. If you wait longer than 13 weeks between Depo-Provera contraceptive injections or longer than 14 weeks between Depo-subQ Provera 104 injections, you could get pregnant. Use another method for birth control if you miss your appointment. You may also need a pregnancy test before receiving another injection. What may interact with this medicine? Do not take this medicine with any of the following medications: -bosentan This medicine may also interact with the following medications: -aminoglutethimide -antibiotics or medicines for infections, especially rifampin, rifabutin, rifapentine, and griseofulvin -aprepitant -barbiturate medicines such as phenobarbital or primidone -bexarotene -carbamazepine -medicines for seizures like ethotoin, felbamate, oxcarbazepine, phenytoin, topiramate -modafinil -St. John's wort This list may not describe all possible interactions. Give your health care provider a list of all the medicines, herbs, non-prescription drugs, or dietary supplements you use. Also tell them if you  smoke, drink alcohol, or use illegal drugs. Some items may interact with your medicine. What should I watch for while using this medicine? This drug does not protect you against HIV infection (AIDS) or other sexually transmitted diseases. Use of this product may cause you to lose calcium from your bones. Loss of calcium may cause weak bones (osteoporosis). Only use this product for more than 2 years if other forms of birth control are not right for you. The longer you use this product for birth control the more likely you will be at risk for weak bones. Ask your health care professional how you can keep strong bones. You may have a change in bleeding pattern or irregular periods. Many females stop having periods while taking this drug. If you have received your injections on time, your chance of being pregnant is very low. If you think you may be pregnant, see your health care professional as soon as possible. Tell your health care professional if you want to get pregnant within the next year. The effect of this medicine may last a long time after you get your last injection. What side effects may I notice from receiving this medicine? Side effects that you should report to your doctor or health care professional as soon as possible: -allergic reactions like skin rash, itching or hives, swelling of the face, lips, or tongue -breast tenderness or discharge -breathing problems -changes in vision -depression -feeling faint or lightheaded, falls -fever -pain in the abdomen, chest, groin, or leg -problems with balance, talking, walking -unusually weak or tired -yellowing of the eyes or skin Side effects that usually do not require medical attention (report to your doctor or health care professional if they continue or are bothersome): -acne -fluid retention and swelling -headache -irregular periods, spotting, or absent periods -temporary pain, itching, or skin reaction at site where  injected -weight gain This list may not describe all possible side effects. Call your doctor for medical advice about side effects. You may report side effects to FDA at 1-800-FDA-1088. Where  should I keep my medicine? This does not apply. The injection will be given to you by a health care professional. NOTE: This sheet is a summary. It may not cover all possible information. If you have questions about this medicine, talk to your doctor, pharmacist, or health care provider.  2019 Elsevier/Gold Standard (2008-03-17 18:37:56)

## 2018-06-26 NOTE — Anesthesia Postprocedure Evaluation (Signed)
Anesthesia Post Note  Patient: Kristine Hurst  Procedure(s) Performed: AN AD HOC LABOR EPIDURAL     Patient location during evaluation: Mother Baby Anesthesia Type: Epidural Level of consciousness: awake and alert Pain management: pain level controlled Vital Signs Assessment: post-procedure vital signs reviewed and stable Respiratory status: spontaneous breathing Cardiovascular status: stable Postop Assessment: no headache, patient able to bend at knees, no backache, no apparent nausea or vomiting, epidural receding, adequate PO intake, spinal receding and able to ambulate Anesthetic complications: no    Last Vitals:  Vitals:   06/25/18 2026 06/26/18 0538  BP: (!) 99/49 99/65  Pulse: 77 72  Resp: 18 16  Temp: 36.8 C 36.7 C  SpO2: 97% 99%    Last Pain:  Vitals:   06/26/18 0538  TempSrc: Oral  PainSc:    Pain Goal: Patients Stated Pain Goal: 3 (06/25/18 1754)                 Salome Arnt

## 2018-07-01 ENCOUNTER — Encounter: Payer: Medicaid Other | Admitting: Obstetrics & Gynecology

## 2018-07-05 ENCOUNTER — Telehealth: Payer: Self-pay | Admitting: Radiology

## 2018-07-05 NOTE — Telephone Encounter (Signed)
Left message for patient to call [id number] to reschedule appointment for virtual visit due to no MD in office on 08/05/18

## 2018-08-04 ENCOUNTER — Ambulatory Visit: Payer: Medicaid Other | Admitting: Family Medicine

## 2018-08-05 ENCOUNTER — Ambulatory Visit: Payer: Medicaid Other | Admitting: Obstetrics & Gynecology

## 2019-02-04 ENCOUNTER — Other Ambulatory Visit: Payer: Self-pay

## 2019-02-04 ENCOUNTER — Ambulatory Visit (HOSPITAL_COMMUNITY)
Admission: EM | Admit: 2019-02-04 | Discharge: 2019-02-04 | Disposition: A | Discharge status: Home / Self Care | Payer: Medicaid Other | Attending: Family Medicine | Admitting: Family Medicine

## 2019-02-04 ENCOUNTER — Encounter (HOSPITAL_COMMUNITY): Payer: Self-pay

## 2019-02-04 DIAGNOSIS — R1013 Epigastric pain: Secondary | ICD-10-CM

## 2019-02-04 DIAGNOSIS — Z3202 Encounter for pregnancy test, result negative: Secondary | ICD-10-CM

## 2019-02-04 LAB — POCT URINALYSIS DIP (DEVICE)
Bilirubin Urine: NEGATIVE
Glucose, UA: NEGATIVE mg/dL
Hgb urine dipstick: NEGATIVE
Ketones, ur: NEGATIVE mg/dL
Leukocytes,Ua: NEGATIVE
Nitrite: NEGATIVE
Protein, ur: NEGATIVE mg/dL
Specific Gravity, Urine: >=1.03 (ref 1.005–1.030)
Urobilinogen, UA: 0.2 mg/dL (ref 0.0–1.0)
pH: 5.5 (ref 5.0–8.0)

## 2019-02-04 LAB — POCT PREGNANCY, URINE: Preg Test, Ur: NEGATIVE

## 2019-02-04 LAB — POC URINE PREG, ED: Preg Test, Ur: NEGATIVE

## 2019-02-04 MED ORDER — SIMETHICONE 125 MG PO CHEW: 125.0000 mg | CHEWABLE_TABLET | Freq: Four times a day (QID) | ORAL | 0 refills | Status: DC | PRN

## 2019-02-04 MED ORDER — OMEPRAZOLE 20 MG PO CPDR: 20.0000 mg | DELAYED_RELEASE_CAPSULE | Freq: Every day | ORAL | 0 refills | Status: DC

## 2019-02-04 NOTE — ED Triage Notes (Addendum)
Pt presents to the UC for pregnancy test. Pt states she is having  abdominal pain and belly button pan x 2 months. Pt states she is having periods and the pregnancy test at home were negative.

## 2019-02-04 NOTE — ED Provider Notes (Signed)
[facility]    CSN: [id number] Arrival date & time: 02/04/19  0909      History   Chief Complaint Chief Complaint  Patient presents with  . Possible Pregnancy    HPI Kristine Hurst is a 23 y.o. female.   Kristine Hurst presents with complaints of abdominal pain which has been ongoing for 2 months. Waxes and wanes. She states she is concerned she is pregnant as she feels like she can feel "kicking and vibration" to abdomen. Hasn't had intercourse in 4 months. Last depo was in April. Has had regular periods. Last was 11/20 and lasted 7 full days with regular flow. Normal urination. Normal bowel movements. Eating can increase her abdominal pain. It is to the center of her abdomen as well as epigastric. Took home pregnancy test last month which was negative. Gave birth last in April of this year.    ROS per HPI, negative if not otherwise mentioned.      Past Medical History:  Diagnosis Date  . Abnormal CT scan, chest 05/17/2012   CT 01/2012:  1cm calcified density RUL, 42mm calcified nodule LLL, +calcified hilar/mediast nodes.  +spleenic calcifications. + from [place] PPD 12/2011    . Medical history non-contributory   . Seasonal allergies     Patient Active Problem List   Diagnosis Date Noted  . SVD (spontaneous vaginal delivery) 06/26/2018  . Labor and delivery indication for care or intervention 06/25/2018  . Supervision of normal pregnancy 11/08/2015  . Maternal morbid obesity, antepartum ([place]) 11/08/2015    Past Surgical History:  Procedure Laterality Date  . INDUCED ABORTION  2013    OB History    Gravida  4   Para  2   Term  2   Preterm  0   AB  2   Living  2     SAB  1   TAB  1   Ectopic  0   Multiple  0   Live Births  2            Home Medications    Prior to Admission medications   Medication Sig Start Date End Date Taking? Authorizing Provider  ibuprofen (ADVIL) 800 MG tablet Take 1 tablet (800 mg total) by mouth  every 8 (eight) hours as needed. 06/26/18   Gavin Pound, CNM  omeprazole (PRILOSEC) 20 MG capsule Take 1 capsule (20 mg total) by mouth daily. 02/04/19   Zigmund Gottron, NP  simethicone (MYLICON) 063 MG chewable tablet Chew 1 tablet (125 mg total) by mouth every 6 (six) hours as needed (gas, bloating). 02/04/19   Zigmund Gottron, NP    Family History Family History  Problem Relation Age of Onset  . Diabetes Other        Great gradparents paternal  . Diabetes Other        Great gradparents maternal  . Hypertension Maternal Grandmother   . Diabetes Maternal Grandmother   . Hypertension Maternal Grandfather   . Allergies Maternal Grandfather   . Heart attack Maternal Grandfather   . Sleep apnea Maternal Grandfather   . Allergies Brother   . Heart disease Other        great maternal grandparent  . Lung cancer Other        great maternal grandparent  . Pancreatic cancer Other        great maternal grandparent  . Melanoma Other        great maternal grandparent  . Asthma  Mother   . Emphysema Other        great grandfather    Social History Social History   Tobacco Use  . Smoking status: Current Every Day Smoker    Packs/day: 0.20    Years: 3.00    Pack years: 0.60    Types: Cigarettes  . Smokeless tobacco: Never Used  . Tobacco comment: 1 cig per day  Substance Use Topics  . Alcohol use: Yes    Alcohol/week: 0.0 standard drinks  . Drug use: No     Allergies   Cefprozil   Review of Systems Review of Systems   Physical Exam Triage Vital Signs ED Triage Vitals  Enc Vitals Group     BP 02/04/19 0933 124/88     Pulse Rate 02/04/19 0933 92     Resp 02/04/19 0933 16     Temp 02/04/19 0933 98.9 F (37.2 C)     Temp Source 02/04/19 0933 Oral     SpO2 02/04/19 0933 97 %     Weight --      Height --      Head Circumference --      Peak Flow --      Pain Score 02/04/19 0929 2     Pain Loc --      Pain Edu? --      Excl. in GC? --    No data found.   Updated Vital Signs BP 124/88 (BP Location: Right Arm)   Pulse 92   Temp 98.9 F (37.2 C) (Oral)   Resp 16   LMP 01/28/2019   SpO2 97%   Breastfeeding No    Physical Exam Constitutional:      General: She is not in acute distress.    Appearance: She is well-developed. She is obese.  Cardiovascular:     Rate and Rhythm: Normal rate.  Pulmonary:     Effort: Pulmonary effort is normal.  Abdominal:     Palpations: Abdomen is soft.     Tenderness: There is abdominal tenderness in the epigastric area.  Skin:    General: Skin is warm and dry.  Neurological:     Mental Status: She is alert and oriented to person, place, and time.      UC Treatments / Results  Labs (all labs ordered are listed, but only abnormal results are displayed) Labs Reviewed  POCT URINALYSIS DIP (DEVICE)  POCT PREGNANCY, URINE  POC URINE PREG, ED    EKG   Radiology No results found.  Procedures Procedures (including critical care time)  Medications Ordered in UC Medications - No data to display  Initial Impression / Assessment and Plan / UC Course  I have reviewed the triage vital signs and the nursing notes.  Pertinent labs & imaging results that were available during my care of the patient were reviewed by me and considered in my medical decision making (see chart for details).     Non toxic. Benign physical exam. Afebrile. Negative home pregnancy test. Regular periods. Epigastric pain. No pelvic pain. Worse with eating. Reflux likely, gas as source of "phantom kicks" discussed as likely. Omeprazole and simethicone provided at this time to trial. Follow up with PCP and/or OB as needed for persistent symptoms. Patient verbalized understanding and agreeable to plan.   Final Clinical Impressions(s) / UC Diagnoses   Final diagnoses:  Epigastric pain  Negative pregnancy test     Discharge Instructions     Your pregnancy test is negative today.  I would like you to start daily  omeprazole to see if this helps with your upper abdominal pain.  May use simethicone as needed for bloating or gas.  Follow up with your primary care provider and/or OB for any persistent concerns or symptoms.     ED Prescriptions    Medication Sig Dispense Auth. Provider   omeprazole (PRILOSEC) 20 MG capsule Take 1 capsule (20 mg total) by mouth daily. 30 capsule Linus Mako B, NP   simethicone (MYLICON) 125 MG chewable tablet Chew 1 tablet (125 mg total) by mouth every 6 (six) hours as needed (gas, bloating). 30 tablet Georgetta Haber, NP     PDMP not reviewed this encounter.   Georgetta Haber, NP 02/04/19 1036

## 2019-02-04 NOTE — Discharge Instructions (Addendum)
Your pregnancy test is negative today.  I would like you to start daily omeprazole to see if this helps with your upper abdominal pain.  May use simethicone as needed for bloating or gas.  Follow up with your primary care provider and/or OB for any persistent concerns or symptoms.

## 2019-02-08 ENCOUNTER — Emergency Department (HOSPITAL_COMMUNITY)
Admission: EM | Admit: 2019-02-08 | Discharge: 2019-02-08 | Disposition: A | Discharge status: Home / Self Care | Payer: Medicaid Other | Attending: Emergency Medicine | Admitting: Emergency Medicine

## 2019-02-08 ENCOUNTER — Emergency Department (HOSPITAL_COMMUNITY): Payer: Medicaid Other

## 2019-02-08 ENCOUNTER — Other Ambulatory Visit: Payer: Self-pay

## 2019-02-08 DIAGNOSIS — N939 Abnormal uterine and vaginal bleeding, unspecified: Secondary | ICD-10-CM | POA: Diagnosis not present

## 2019-02-08 DIAGNOSIS — R1013 Epigastric pain: Secondary | ICD-10-CM | POA: Diagnosis present

## 2019-02-08 DIAGNOSIS — B9689 Other specified bacterial agents as the cause of diseases classified elsewhere: Secondary | ICD-10-CM | POA: Insufficient documentation

## 2019-02-08 DIAGNOSIS — F17210 Nicotine dependence, cigarettes, uncomplicated: Secondary | ICD-10-CM | POA: Diagnosis not present

## 2019-02-08 DIAGNOSIS — N760 Acute vaginitis: Secondary | ICD-10-CM | POA: Diagnosis not present

## 2019-02-08 DIAGNOSIS — R1033 Periumbilical pain: Secondary | ICD-10-CM | POA: Insufficient documentation

## 2019-02-08 LAB — CBC
HCT: 42.2 % (ref 36.0–46.0)
Hemoglobin: 13.8 g/dL (ref 12.0–15.0)
MCH: 30.3 pg (ref 26.0–34.0)
MCHC: 32.7 g/dL (ref 30.0–36.0)
MCV: 92.7 fL (ref 80.0–100.0)
Platelets: 343 10*3/uL (ref 150–400)
RBC: 4.55 MIL/uL (ref 3.87–5.11)
RDW: 12.7 % (ref 11.5–15.5)
WBC: 10.3 10*3/uL (ref 4.0–10.5)
nRBC: 0 % (ref 0.0–0.2)

## 2019-02-08 LAB — COMPREHENSIVE METABOLIC PANEL
ALT: 17 U/L (ref 0–44)
AST: 16 U/L (ref 15–41)
Albumin: 3.8 g/dL (ref 3.5–5.0)
Alkaline Phosphatase: 59 U/L (ref 38–126)
Anion gap: 10 (ref 5–15)
BUN: 9 mg/dL (ref 6–20)
CO2: 25 mmol/L (ref 22–32)
Calcium: 9.3 mg/dL (ref 8.9–10.3)
Chloride: 103 mmol/L (ref 98–111)
Creatinine, Ser: 0.82 mg/dL (ref 0.44–1.00)
GFR calc Af Amer: >60 mL/min (ref >60)
GFR calc non Af Amer: >60 mL/min (ref >60)
Glucose, Bld: 81 mg/dL (ref 70–99)
Potassium: 3.7 mmol/L (ref 3.5–5.1)
Sodium: 138 mmol/L (ref 135–145)
Total Bilirubin: 0.4 mg/dL (ref 0.3–1.2)
Total Protein: 6.8 g/dL (ref 6.5–8.1)

## 2019-02-08 LAB — WET PREP, GENITAL
Sperm: NONE SEEN
Trich, Wet Prep: NONE SEEN
Yeast Wet Prep HPF POC: NONE SEEN

## 2019-02-08 LAB — URINALYSIS, ROUTINE W REFLEX MICROSCOPIC
Bacteria, UA: NONE SEEN
Bilirubin Urine: NEGATIVE
Glucose, UA: NEGATIVE mg/dL
Ketones, ur: NEGATIVE mg/dL
Leukocytes,Ua: NEGATIVE
Nitrite: NEGATIVE
Protein, ur: NEGATIVE mg/dL
Specific Gravity, Urine: 1.014 (ref 1.005–1.030)
pH: 6 (ref 5.0–8.0)

## 2019-02-08 LAB — I-STAT BETA HCG BLOOD, ED (MC, WL, AP ONLY): I-stat hCG, quantitative: <5 m[IU]/mL (ref <5)

## 2019-02-08 LAB — LIPASE, BLOOD: Lipase: 19 U/L (ref 11–51)

## 2019-02-08 MED ORDER — METRONIDAZOLE 500 MG PO TABS: 500.0000 mg | ORAL_TABLET | Freq: Two times a day (BID) | ORAL | 0 refills | Status: DC

## 2019-02-08 NOTE — ED Triage Notes (Signed)
Pt here for two months of abdominal pain, radiation to R side and back. Pt having more regular bowel movements than her normal but sts it is not diarrhea. Denies n/v.  Pt having irregular and more frequent periods, bleeding now but just had a period on 11/20 that lasted for a week, as well as a one week period on 11/6.

## 2019-02-08 NOTE — Discharge Instructions (Signed)
You were seen in the ER for abdominal pain. You also reported vaginal discharge and irregular spotting.   Labs were normal. Ultrasound of liver and gallbladder were **  I suspect your symptoms may be from gastritis, acid reflux.  These are managed similarly.   We will treat this with anti-acid medicines.  Start taking over the counter omeprazole to 40 mg daily, take on an empty stomach first thing in the morning and wait 20-30 min before eating.  Add famotidine 20 mg in the AM and PM.   Acid reflux and gastritis are largely controlled with diet changes.  Avoid irritating foods and liquids such as alcohol, greasy/fatty or acidic foods. Avoid ibuprofen and aspirin containing products.   Follow up with primary care doctor or GI for further management of this if it persits.   Return to the ER for fever, chills, blood in vomit or stool, worsening localized abdominal pain to right upper or right lower abdomen, inability to tolerate fluids.  Pelvic swabs showed bacterial vaginosis which is an overgrowth of bacteria in the vagina that can cause vaginal discharge, take flagyl for this.  Follow up with GYN if irregular vaginal spotting persists. Return to ER for heaving hemorrhage or vaginal bleeding, pelvic pain

## 2019-02-08 NOTE — ED Provider Notes (Signed)
[facility] EMERGENCY DEPARTMENT Provider Note   CSN: [id number] Arrival date & time: 02/08/19  1401     History   Chief Complaint Chief Complaint  Patient presents with  . Abdominal Pain  . Back Pain  . Vaginal Bleeding    HPI Kristine Hurst is a 23 y.o. female presents to the ER for evaluation of abdominal pain.  She reports 2 different types of pain.  She has had upper epigastric and periumbilical abdominal pain for the last 2 months that is described as "tightness", pressure and tender.  This pain was initially intermittent but now it is more constant.  It is worse if she lays down for long period of time or she stands up for a long period of time.  Occasionally food will exacerbate the pain.  Lately she has been drinking more soda, Lipton tea and beer and feels like these also exacerbate her symptoms.  Has always had acid reflux type symptoms but states for the last couple of months she has had increased belching, gas, bloating, abdominal discomfort and fullness specifically at nighttime.  She went to urgent care recently for this because she thought it was related to pregnancy however she had a negative pregnancy test and she was given gas medicine.  Has tried ibuprofen for the pain without any improvement.  No history of gastritis, PUD.  Denies any vomiting, hematemesis, coffee-ground emesis, changes in her stool.  No previous abdominal surgeries.  History of 4 previous pregnancies, 2 miscarriages and 2 children.   Also reports for the last few days she has had right-sided mid abdominal and right lower quadrant abdominal pain that is stabbing in nature.  She is not sure if this is related to her ongoing upper abdominal pain or not but this morning she had abnormal vaginal spotting which prompted ER visit.  She had 1 dose of Depo injection in April 2020.  For the last few weeks she has had irregular vaginal bleeding.  On 11/20 she had a normal week long menstrual cycle but  states 2 weeks prior to that she had some brown vaginal spotting.  Today she used the bathroom at work and noticed dark brown vaginal discharge.  Has noticed green vaginal discharge for the last 2 days.  No vaginal or discharge odor, itching, lesions. She is sexually active with men only with out condom use.  Last time she was sexually active was in August 2020.  She is not concerned about STDs at this time.  No fever, dysuria, hematuria or urinary frequency.  Previous to this she has had regular monthly cycles.  No known pelvic conditions in the past like endometriosis, fibroids, cysts, STDs.    HPI  Past Medical History:  Diagnosis Date  . Abnormal CT scan, chest 05/17/2012   CT 01/2012:  1cm calcified density RUL, 68mm calcified nodule LLL, +calcified hilar/mediast nodes.  +spleenic calcifications. + from [place] PPD 12/2011    . Medical history non-contributory   . Seasonal allergies     Patient Active Problem List   Diagnosis Date Noted  . SVD (spontaneous vaginal delivery) 06/26/2018  . Labor and delivery indication for care or intervention 06/25/2018  . Supervision of normal pregnancy 11/08/2015  . Maternal morbid obesity, antepartum ([place]) 11/08/2015    Past Surgical History:  Procedure Laterality Date  . INDUCED ABORTION  2013     OB History    Gravida  4   Para  2   Term  2  Preterm  0   AB  2   Living  2     SAB  1   TAB  1   Ectopic  0   Multiple  0   Live Births  2            Home Medications    Prior to Admission medications   Medication Sig Start Date End Date Taking? Authorizing Provider  ibuprofen (ADVIL) 800 MG tablet Take 1 tablet (800 mg total) by mouth every 8 (eight) hours as needed. 06/26/18   Gerrit Heck, CNM  metroNIDAZOLE (FLAGYL) 500 MG tablet Take 1 tablet (500 mg total) by mouth 2 (two) times daily. 02/08/19   Liberty Handy, PA-C  omeprazole (PRILOSEC) 20 MG capsule Take 1 capsule (20 mg total) by mouth daily. 02/04/19    Georgetta Haber, NP  simethicone (MYLICON) 125 MG chewable tablet Chew 1 tablet (125 mg total) by mouth every 6 (six) hours as needed (gas, bloating). 02/04/19   Georgetta Haber, NP    Family History Family History  Problem Relation Age of Onset  . Diabetes Other        Great gradparents paternal  . Diabetes Other        Great gradparents maternal  . Hypertension Maternal Grandmother   . Diabetes Maternal Grandmother   . Hypertension Maternal Grandfather   . Allergies Maternal Grandfather   . Heart attack Maternal Grandfather   . Sleep apnea Maternal Grandfather   . Allergies Brother   . Heart disease Other        great maternal grandparent  . Lung cancer Other        great maternal grandparent  . Pancreatic cancer Other        great maternal grandparent  . Melanoma Other        great maternal grandparent  . Asthma Mother   . Emphysema Other        great grandfather    Social History Social History   Tobacco Use  . Smoking status: Current Every Day Smoker    Packs/day: 0.20    Years: 3.00    Pack years: 0.60    Types: Cigarettes  . Smokeless tobacco: Never Used  . Tobacco comment: 1 cig per day  Substance Use Topics  . Alcohol use: Yes    Alcohol/week: 0.0 standard drinks  . Drug use: No     Allergies   Cefprozil   Review of Systems Review of Systems  Gastrointestinal: Positive for abdominal pain.       Increased belching, gas, fullness  Genitourinary: Positive for menstrual problem, vaginal bleeding and vaginal discharge.  All other systems reviewed and are negative.    Physical Exam Updated Vital Signs BP 102/81 (BP Location: Right Arm)   Pulse 94   Temp 98.3 F (36.8 C) (Oral)   Resp 16   LMP 01/27/2019 (Exact Date)   SpO2 100%   Physical Exam Vitals signs and nursing note reviewed.  Constitutional:      Appearance: She is well-developed.     Comments: Non toxic in NAD  HENT:     Head: Normocephalic and atraumatic.     Nose: Nose  normal.  Eyes:     Conjunctiva/sclera: Conjunctivae normal.  Neck:     Musculoskeletal: Normal range of motion.  Cardiovascular:     Rate and Rhythm: Normal rate and regular rhythm.  Pulmonary:     Effort: Pulmonary effort is normal.  Breath sounds: Normal breath sounds.  Abdominal:     General: Bowel sounds are normal.     Palpations: Abdomen is soft.     Tenderness: There is abdominal tenderness in the epigastric area and periumbilical area.     Comments: Obese abdomen.  Mild epigastric and periumbilical tenderness. No RUQ tenderness. No G/R/R. No suprapubic or CVA tenderness. Negative Murphy's and McBurney's. Active BS to lower quadrants.   Genitourinary:    Vagina: Vaginal discharge present.     Comments:  Exam performed with RN at bedside for assistance. External genitalia without lesions.  No groin lymphadenopathy.  Vaginal mucosa pink without lesions, scant green tinged/clear discharge around cervix.  Cervical os visualized is erythematous but non friable, without lesions and closed.  No CMT.  Nonpalpable, nontender adnexa.  Perianal skin normal without lesions. Musculoskeletal: Normal range of motion.  Skin:    General: Skin is warm and dry.     Capillary Refill: Capillary refill takes less than 2 seconds.  Neurological:     Mental Status: She is alert.  Psychiatric:        Behavior: Behavior normal.      ED Treatments / Results  Labs (all labs ordered are listed, but only abnormal results are displayed) Labs Reviewed  WET PREP, GENITAL - Abnormal; Notable for the following components:      Result Value   Clue Cells Wet Prep HPF POC PRESENT (*)    WBC, Wet Prep HPF POC MANY (*)    All other components within normal limits  URINALYSIS, ROUTINE W REFLEX MICROSCOPIC - Abnormal; Notable for the following components:   Hgb urine dipstick SMALL (*)    All other components within normal limits  LIPASE, BLOOD  COMPREHENSIVE METABOLIC PANEL  CBC  I-STAT BETA HCG  BLOOD, ED ([facility], [facility], [facility] ONLY)  GC/CHLAMYDIA PROBE AMP () NOT AT [facility]    EKG None  Radiology No results found.  Procedures Procedures (including critical care time)  Medications Ordered in ED Medications - No data to display   Initial Impression / Assessment and Plan / ED Course  I have reviewed the triage vital signs and the nursing notes.  Pertinent labs & imaging results that were available during my care of the patient were reviewed by me and considered in my medical decision making (see chart for details).  EMR reviewed.  Recent urgent care visit reviewed.  Patient presents today with what sounds like 2 different types of abdominal pain, new vaginal spotting and discharge.  She has had epigastric periumbilical abdominal discomfort, increased belching, gas, fullness for the last 2 months.  History of acid reflux in the past that seems to be getting worse.  She has nighttime fullness as well.  On exam she is epigastric and periumbilical abdominal tenderness but no right upper quadrant or Murphy sign.  ER work-up initiated in triage, reviewed and interpreted by me.  LFTs, lipase normal.  Given her persistent symptoms, we will obtain a right upper quadrant ultrasound.  DDX includes cholelithiasis, GERD, gastritis, PUD.  Given chronicity of symptoms, abdominal exam, normal white count I doubt acute cholecystitis, pancreatitis, hepatitis.    In regards to her vaginal complaints and right-sided abdominal pain - no right lower quadrant or lower abdominal tenderness, suprapubic or CVA tenderness.  No urinary symptoms. Ddx includes irregular menses related to Depo vs cyst vs fibroid vs other.  Overall low risk sexual practices.  Pelvic exam without CMT.  No leukocytosis, fevers and PID is  considered unlikely.  She only had minimal brown spotting today that has stopped, hemoglobin is normal.  Wet prep shows clue cells which could cause some discharge and we will plan to treat for this.   UA has 6-10 RBCs in setting of vaginal spotting but no signs of infection.  Clinically, she has no symptoms to suggest a renal process like a stone, UTI.  Discussed with patient irregular vaginal spotting may be related to Depo or possibly ovarian cyst or fibroids.  Discussed with her emergent ultrasound could point to etiology but likely not needed emergently.  She is comfortable deferring this and following up with GYN regarding this.  Her primary concern is her ongoing upper abdominal pain.  We will DC with GYN follow-up.  1830: RUQ [place] pending.  Hand off to oncoming EDPA. Anticipate dc with omeprazole, famotidine, diet changes and GI f/u and GYN f/u.  Plan was discussed with patient prior to shift change, she is agreeable.  Final Clinical Impressions(s) / ED Diagnoses   Final diagnoses:  Epigastric abdominal pain  Abnormal vaginal bleeding  Bacterial vaginosis    ED Discharge Orders         Ordered    metroNIDAZOLE (FLAGYL) 500 MG tablet  2 times daily     02/08/19 1834           Liberty Handy, PA-C 02/08/19 1834    Charlynne Pander, MD 02/09/19 [id number]

## 2019-02-08 NOTE — ED Provider Notes (Signed)
  Physical Exam  BP 102/81 (BP Location: Right Arm)   Pulse 94   Temp 98.3 F (36.8 C) (Oral)   Resp 16   LMP 01/27/2019 (Exact Date)   SpO2 100%   Physical Exam  ED Course/Procedures   Clinical Course as of Feb 07 2009  Tue Feb 08, 2019  1821 In setting of vaginal bleeding/spotting   Hgb urine dipstick(!): SMALL [CG]  1821 Clue Cells Wet Prep HPF POC(!): PRESENT [CG]  1821 WBC, Wet Prep HPF POC(!): MANY [CG]    Clinical Course User Index [CG] Kinnie Feil, PA-C    Procedures  MDM  Patient care assumed from [place], [place] due to change of shift.  Patient presenting with abdominal pain for several months.  See her HPI for full history.  Currently pending right upper quadrant abdominal ultrasound.  Likely no emergent findings.  Will need follow-up with GI as well as OB/GYN for abnormal vaginal bleeding.  Patient's gallbladder ultrasound was negative for any gallstones or Coley cystitis.  Patient appears well.  She is ready for discharge.  Advised her that she should follow-up with the specialist so that they can continue the work-up and perform testing that is not routinely done in the emergency department such as upper GI study etc.  All questions answered and patient discharged in stable condition.   Alveria Apley, PA-C 02/08/19 2050    Rex Kras Wenda Overland, MD 02/08/19 2126

## 2019-02-08 NOTE — ED Notes (Signed)
Patient verbalizes understanding of discharge instructions. Opportunity for questioning and answers were provided. Armband removed by staff, pt discharged from ED ambulatory.   

## 2019-02-10 LAB — GC/CHLAMYDIA PROBE AMP (~~LOC~~) NOT AT ARMC
Chlamydia: NEGATIVE
Neisseria Gonorrhea: NEGATIVE

## 2020-03-30 ENCOUNTER — Other Ambulatory Visit: Payer: Self-pay

## 2020-03-30 ENCOUNTER — Ambulatory Visit
Admission: EM | Admit: 2020-03-30 | Discharge: 2020-03-30 | Disposition: A | Discharge status: Home / Self Care | Payer: Medicaid Other | Attending: Family Medicine | Admitting: Family Medicine

## 2020-03-30 ENCOUNTER — Encounter: Payer: Self-pay | Admitting: Emergency Medicine

## 2020-03-30 ENCOUNTER — Ambulatory Visit (INDEPENDENT_AMBULATORY_CARE_PROVIDER_SITE_OTHER): Payer: Medicaid Other

## 2020-03-30 DIAGNOSIS — R109 Unspecified abdominal pain: Secondary | ICD-10-CM | POA: Diagnosis not present

## 2020-03-30 DIAGNOSIS — R1013 Epigastric pain: Secondary | ICD-10-CM | POA: Diagnosis not present

## 2020-03-30 DIAGNOSIS — K5900 Constipation, unspecified: Secondary | ICD-10-CM | POA: Diagnosis not present

## 2020-03-30 LAB — POCT URINALYSIS DIP (MANUAL ENTRY)
Bilirubin, UA: NEGATIVE
Blood, UA: NEGATIVE
Glucose, UA: NEGATIVE mg/dL
Ketones, POC UA: NEGATIVE mg/dL
Leukocytes, UA: NEGATIVE
Nitrite, UA: NEGATIVE
Protein Ur, POC: NEGATIVE mg/dL
Spec Grav, UA: >=1.03 — AB (ref 1.010–1.025)
Urobilinogen, UA: 0.2 E.U./dL
pH, UA: 5 (ref 5.0–8.0)

## 2020-03-30 LAB — POCT URINE PREGNANCY: Preg Test, Ur: NEGATIVE

## 2020-03-30 MED ORDER — OMEPRAZOLE 20 MG PO CPDR: 20.0000 mg | DELAYED_RELEASE_CAPSULE | Freq: Every day | ORAL | 0 refills | Status: DC

## 2020-03-30 MED ORDER — SIMETHICONE 125 MG PO CHEW: 125.0000 mg | CHEWABLE_TABLET | Freq: Four times a day (QID) | ORAL | 0 refills | Status: DC | PRN

## 2020-03-30 NOTE — Discharge Instructions (Addendum)
Your xrays show stool in the proximal large colon  Try miralax as well as omeprazole and simethicone  Follow up with OB about irregular periods  Follow up with GI for possible further imaging and further evaluation  Follow up with this office or with primary care if symptoms are persisting.  Follow up in the ER for high fever, trouble swallowing, trouble breathing, other concerning symptoms.

## 2020-03-30 NOTE — ED Triage Notes (Signed)
Upper abdominal pain, after she eats.  Has been going on for about 2-3 months.  Pt has taken multiple pregnancy tests.

## 2020-04-02 NOTE — ED Provider Notes (Signed)
[facility]   [id number] 03/30/20 Arrival Time: 1401  CC: ABDOMINAL PAIN  SUBJECTIVE:  Kristine Hurst is a 25 y.o. female who presents with upper abdominal pain after she eats intermittently for the last 2 to 3 months.  Reports that she has taken multiple pregnancy tests that have been negative.  Has not taken any medications for this at home.  Has history of GERD. Denies a precipitating event, trauma, close contacts with similar symptoms, recent travel or antibiotic use. Denies abdominal cramping.  Reports that her symptoms are worse at night.  Denies alleviating or aggravating factors. Denies similar symptoms in the past. Last BM yesterday.    Denies fever, chills, appetite changes, weight changes, nausea, vomiting, chest pain, SOB, diarrhea, constipation, hematochezia, melena, dysuria, difficulty urinating, increased frequency or urgency, flank pain, loss of bowel or bladder function, vaginal discharge, vaginal odor, vaginal bleeding, dyspareunia, pelvic pain.     Patient's last menstrual period was 02/05/2020.  ROS: As per HPI.  All other pertinent ROS negative.     Past Medical History:  Diagnosis Date  . Abnormal CT scan, chest 05/17/2012   CT 01/2012:  1cm calcified density RUL, 48mm calcified nodule LLL, +calcified hilar/mediast nodes.  +spleenic calcifications. + from [place] Neg PPD 12/2011    . Medical history non-contributory   . Seasonal allergies    Past Surgical History:  Procedure Laterality Date  . INDUCED ABORTION  2013   Allergies  Allergen Reactions  . Cefprozil Hives and Other (See Comments)    Reaction to Cefzil - unknown childhood allergic reaction   No current facility-administered medications on file prior to encounter.   Current Outpatient Medications on File Prior to Encounter  Medication Sig Dispense Refill  . ibuprofen (ADVIL) 800 MG tablet Take 1 tablet (800 mg total) by mouth every 8 (eight) hours as needed. 30 tablet 0  . metroNIDAZOLE  (FLAGYL) 500 MG tablet Take 1 tablet (500 mg total) by mouth 2 (two) times daily. 14 tablet 0   Social History   Socioeconomic History  . Marital status: Single    Spouse name: Not on file  . Number of children: Not on file  . Years of education: Not on file  . Highest education level: Not on file  Occupational History  . Occupation: Conservation officer, nature    Comment: [employer]  Tobacco Use  . Smoking status: Current Every Day Smoker    Packs/day: 0.20    Years: 3.00    Pack years: 0.60    Types: Cigarettes  . Smokeless tobacco: Never Used  . Tobacco comment: 1 cig per day  Substance and Sexual Activity  . Alcohol use: Yes    Alcohol/week: 0.0 standard drinks  . Drug use: No  . Sexual activity: Yes    Birth control/protection: None  Other Topics Concern  . Not on file  Social History Narrative  . Not on file   Social Determinants of Health   Financial Resource Strain: Not on file  Food Insecurity: Not on file  Transportation Needs: Not on file  Physical Activity: Not on file  Stress: Not on file  Social Connections: Not on file  Intimate Partner Violence: Not on file   Family History  Problem Relation Age of Onset  . Diabetes Other        Great gradparents paternal  . Diabetes Other        Great gradparents maternal  . Hypertension Maternal Grandmother   . Diabetes Maternal Grandmother   .  Hypertension Maternal Grandfather   . Allergies Maternal Grandfather   . Heart attack Maternal Grandfather   . Sleep apnea Maternal Grandfather   . Allergies Brother   . Heart disease Other        great maternal grandparent  . Lung cancer Other        great maternal grandparent  . Pancreatic cancer Other        great maternal grandparent  . Melanoma Other        great maternal grandparent  . Asthma Mother   . Emphysema Other        great grandfather     OBJECTIVE:  Vitals:   03/30/20 1452  BP: 111/76  Pulse: 82  Resp: 17  Temp: 98.7 F (37.1 C)  TempSrc: Oral  SpO2:  97%  Weight: 279 lb (126.6 kg)    General appearance: Alert; NAD HEENT: NCAT.  Oropharynx clear.  Lungs: clear to auscultation bilaterally without adventitious breath sounds Heart: regular rate and rhythm.  Radial pulses 2+ symmetrical bilaterally Abdomen: soft, non-distended; normal active bowel sounds; non-tender to light and deep palpation; nontender at McBurney's point; negative Murphy's sign; negative rebound; no guarding Back: no CVA tenderness Extremities: no edema; symmetrical with no gross deformities Skin: warm and dry Neurologic: normal gait Psychological: alert and cooperative; normal mood and affect  LABS: No results found for this or any previous visit (from the past 24 hour(s)).  DIAGNOSTIC STUDIES: No results found.   ASSESSMENT & PLAN:  1. Epigastric pain   2. Constipation, unspecified constipation type     Meds ordered this encounter  Medications  . omeprazole (PRILOSEC) 20 MG capsule    Sig: Take 1 capsule (20 mg total) by mouth daily.    Dispense:  30 capsule    Refill:  0    Order Specific Question:   Supervising Provider    Answer:   Merrilee Jansky [id number]  . simethicone (MYLICON) 125 MG chewable tablet    Sig: Chew 1 tablet (125 mg total) by mouth every 6 (six) hours as needed (gas, bloating).    Dispense:  30 tablet    Refill:  0    Order Specific Question:   Supervising Provider    Answer:   Merrilee Jansky [id number]    Urine pregnancy negative today UA unremarkable X-ray today shows no acute findings with a nonobstructive bowel gas pattern, as well as a mild stool burden Discussed that she may also want to try some MiraLAX or increasing fiber to help move this through her colon Upon chart review, patient has been seen for this multiple times in the past Prescribed omeprazole once daily Prescribe simethicone as well Get rest and drink plenty of fluids  Advance to bland foods, applesauce, rice, baked or boiled chicken, ect. Avoid  milk, greasy foods and anything that doesn't agree with you.  If you experience new or worsening symptoms return or go to ER such as fever, chills, nausea, vomiting, diarrhea, bloody or dark tarry stools, constipation, urinary symptoms, worsening abdominal discomfort, symptoms that do not improve with medications, inability to keep fluids down.  Reviewed expectations re: course of current medical issues. Questions answered. Outlined signs and symptoms indicating need for more acute intervention. Patient verbalized understanding. After Visit Summary given.   Moshe Cipro, NP 04/02/20 1021

## 2020-08-21 ENCOUNTER — Ambulatory Visit
Admission: EM | Admit: 2020-08-21 | Discharge: 2020-08-21 | Discharge status: Against Medical Advice | Payer: Medicaid Other

## 2020-08-21 ENCOUNTER — Encounter: Payer: Self-pay | Admitting: Emergency Medicine

## 2020-08-21 ENCOUNTER — Ambulatory Visit
Admission: EM | Admit: 2020-08-21 | Discharge: 2020-08-21 | Disposition: A | Discharge status: Home / Self Care | Payer: Medicaid Other | Attending: Family Medicine | Admitting: Family Medicine

## 2020-08-21 DIAGNOSIS — R059 Cough, unspecified: Secondary | ICD-10-CM | POA: Diagnosis not present

## 2020-08-21 DIAGNOSIS — J069 Acute upper respiratory infection, unspecified: Secondary | ICD-10-CM

## 2020-08-21 NOTE — ED Provider Notes (Signed)
[facility]   [id number] 08/21/20 Arrival Time: 1409  ASSESSMENT & PLAN:  1. Cough   2. Viral URI     COVID-19 testing sent. Discussed typical duration of viral illnesses. OTC symptom care as needed.    Follow-up Information     [facility] at [facility].   Specialty: Urgent Care Why: As needed. Contact information: [address], [address] [place] [address] [phone]                Reviewed expectations re: course of current medical issues. Questions answered. Outlined signs and symptoms indicating need for more acute intervention. Understanding verbalized. After Visit Summary given.   SUBJECTIVE: History from: patient. Kristine Hurst is a 25 y.o. female who reports mild ST and cough. Denies: fever and difficulty breathing. Normal PO intake without n/v/d. Child sick with simlar; sev days.   OBJECTIVE:  Vitals:   08/21/20 1450  BP: 115/72  Pulse: 77  Resp: 16  Temp: (!) 97.5 F (36.4 C)  TempSrc: Tympanic  SpO2: 95%    General appearance: alert; no distress Eyes: PERRLA; EOMI; conjunctiva normal HENT: [place]; AT; with mild nasal congestion Neck: supple; throat mild cobblestoning Lungs: speaks full sentences without difficulty; unlabored Extremities: no edema Skin: warm and dry Neurologic: normal gait Psychological: alert and cooperative; normal mood and affect  Labs:  Labs Reviewed  NOVEL CORONAVIRUS, NAA    Allergies  Allergen Reactions   Cefprozil Hives and Other (See Comments)    Reaction to Cefzil - unknown childhood allergic reaction    Past Medical History:  Diagnosis Date   Abnormal CT scan, chest 05/17/2012   CT 01/2012:  1cm calcified density RUL, 38mm calcified nodule LLL, +calcified hilar/mediast nodes.  +spleenic calcifications. + from [place] Neg PPD 12/2011     Medical history non-contributory    Seasonal allergies    Social History   Socioeconomic History   Marital  status: Single    Spouse name: Not on file   Number of children: Not on file   Years of education: Not on file   Highest education level: Not on file  Occupational History   Occupation: Conservation officer, nature    Comment: [employer]  Tobacco Use   Smoking status: Every Day    Packs/day: 0.20    Years: 3.00    Pack years: 0.60    Types: Cigarettes   Smokeless tobacco: Never   Tobacco comments:    1 cig per day  Substance and Sexual Activity   Alcohol use: Yes    Alcohol/week: 0.0 standard drinks   Drug use: No   Sexual activity: Yes    Birth control/protection: None  Other Topics Concern   Not on file  Social History Narrative   Not on file   Social Determinants of Health   Financial Resource Strain: Not on file  Food Insecurity: Not on file  Transportation Needs: Not on file  Physical Activity: Not on file  Stress: Not on file  Social Connections: Not on file  Intimate Partner Violence: Not on file   Family History  Problem Relation Age of Onset   Diabetes Other        Great gradparents paternal   Diabetes Other        Great gradparents maternal   Hypertension Maternal Grandmother    Diabetes Maternal Grandmother    Hypertension Maternal Grandfather    Allergies Maternal Grandfather    Heart attack Maternal Grandfather    Sleep apnea  Maternal Grandfather    Allergies Brother    Heart disease Other        great maternal grandparent   Lung cancer Other        great maternal grandparent   Pancreatic cancer Other        great maternal grandparent   Melanoma Other        great maternal grandparent   Asthma Mother    Emphysema Other        great grandfather   Past Surgical History:  Procedure Laterality Date   INDUCED ABORTION  2013     Mardella Layman, MD 08/21/20 1529

## 2020-08-21 NOTE — ED Triage Notes (Signed)
Sore throat that started this morning 

## 2020-08-21 NOTE — Discharge Instructions (Addendum)
You have been tested for COVID-19 today. °If your test returns positive, you will receive a phone call from [place] regarding your results. °Negative test results are not called. °Both positive and negative results area always visible on MyChart. °If you do not have a MyChart account, sign up instructions are provided in your discharge papers. °Please do not hesitate to contact us should you have questions or concerns. ° °

## 2020-08-22 LAB — SARS-COV-2, NAA 2 DAY TAT

## 2020-08-22 LAB — NOVEL CORONAVIRUS, NAA: SARS-CoV-2, NAA: NOT DETECTED

## 2020-09-25 ENCOUNTER — Encounter: Payer: Self-pay | Admitting: Obstetrics and Gynecology

## 2020-09-25 ENCOUNTER — Other Ambulatory Visit: Payer: Self-pay

## 2020-09-25 ENCOUNTER — Ambulatory Visit (INDEPENDENT_AMBULATORY_CARE_PROVIDER_SITE_OTHER): Payer: Medicaid Other | Admitting: Obstetrics and Gynecology

## 2020-09-25 ENCOUNTER — Other Ambulatory Visit (HOSPITAL_COMMUNITY)
Admission: RE | Admit: 2020-09-25 | Discharge: 2020-09-25 | Disposition: A | Discharge status: Home / Self Care | Payer: Medicaid Other | Source: Ambulatory Visit | Attending: Obstetrics and Gynecology | Admitting: Obstetrics and Gynecology

## 2020-09-25 VITALS — BP 134/82 | HR 86 | Ht 67.0 in | Wt 289.0 lb

## 2020-09-25 DIAGNOSIS — Z124 Encounter for screening for malignant neoplasm of cervix: Secondary | ICD-10-CM | POA: Insufficient documentation

## 2020-09-25 DIAGNOSIS — Z3009 Encounter for other general counseling and advice on contraception: Secondary | ICD-10-CM | POA: Diagnosis not present

## 2020-09-25 NOTE — Progress Notes (Signed)
25 yo P2 with BMI 45 presenting today for sterilization consultation. Patient reports a monthly period lasting 4-7 days. She denies pelvic pain or abnormal discharge. She is not currently using any contraception. Patient is without any other complaints. She desires permanent sterilization  Past Medical History:  Diagnosis Date   Abnormal CT scan, chest 05/17/2012   CT 01/2012:  1cm calcified density RUL, 63mm calcified nodule LLL, +calcified hilar/mediast nodes.  +spleenic calcifications. + from [place] Neg PPD 12/2011     Medical history non-contributory    Seasonal allergies    Past Surgical History:  Procedure Laterality Date   INDUCED ABORTION  2013   Family History  Problem Relation Age of Onset   Diabetes Other        Great gradparents paternal   Diabetes Other        Great gradparents maternal   Hypertension Maternal Grandmother    Diabetes Maternal Grandmother    Hypertension Maternal Grandfather    Allergies Maternal Grandfather    Heart attack Maternal Grandfather    Sleep apnea Maternal Grandfather    Allergies Brother    Heart disease Other        great maternal grandparent   Lung cancer Other        great maternal grandparent   Pancreatic cancer Other        great maternal grandparent   Melanoma Other        great maternal grandparent   Asthma Mother    Emphysema Other        great grandfather   Social History   Tobacco Use   Smoking status: Every Day    Packs/day: 0.20    Years: 3.00    Pack years: 0.60    Types: Cigarettes   Smokeless tobacco: Never   Tobacco comments:    1 cig per day  Substance Use Topics   Alcohol use: Yes    Alcohol/week: 0.0 standard drinks   Drug use: No   ROS See pertinent in HPI. All other systems reviewed and non contributory  Blood pressure 134/82, pulse 86, height 5\' 7"  (1.702 m), weight 289 lb (131.1 kg).  GENERAL: Well-developed, well-nourished female in no acute distress.  HEENT: Normocephalic, atraumatic. Sclerae  anicteric.  NECK: Supple. Normal thyroid.  LUNGS: Clear to auscultation bilaterally.  HEART: Regular rate and rhythm. BREASTS: Symmetric in size. No palpable masses or lymphadenopathy, skin changes, or nipple drainage. ABDOMEN: Soft, nontender, nondistended. No organomegaly. PELVIC: Normal external female genitalia. Vagina is pink and rugated.  Normal discharge. Normal appearing cervix. Uterus is normal in size.  No adnexal mass or tenderness. EXTREMITIES: No cyanosis, clubbing, or edema, 2+ distal pulses.  A/P 25 yo who desires permanent sterilization - Other reversible forms of contraception were discussed with patient; she declines all other modalities.   - Risks of procedure discussed with patient including permanence of method, bleeding, infection, injury to surrounding organs and need for additional procedures including laparotomy, risk of regret.  Failure risk of 0.5-1% with increased risk of ectopic gestation if pregnancy occurs was also discussed with patient.   - Discussed salpingectomy vs coagulation- patient opted for lsc btl with coagulation - Pap smear collected today - Patient declined STI testing

## 2020-09-27 LAB — CYTOLOGY - PAP: Diagnosis: NEGATIVE

## 2020-10-23 ENCOUNTER — Encounter: Payer: Self-pay | Admitting: *Deleted

## 2020-10-23 ENCOUNTER — Telehealth: Payer: Self-pay | Admitting: *Deleted

## 2020-10-23 NOTE — Telephone Encounter (Signed)
Message received that patient has called office  and desires to proceed with scheduling. Surgery scheduled on 12-11-20 at [facility] at 0930- arrive -730. Left message to call back if date does not work otherwise will receive letter in the mail.

## 2020-10-23 NOTE — Telephone Encounter (Signed)
Call to patient- voice mail has first and last name confirmation. Left message calling to check on date of 10-4 for procedure. Request call back to [phone].

## 2020-12-06 ENCOUNTER — Other Ambulatory Visit: Payer: Self-pay

## 2020-12-06 ENCOUNTER — Encounter (HOSPITAL_BASED_OUTPATIENT_CLINIC_OR_DEPARTMENT_OTHER): Payer: Self-pay | Admitting: Obstetrics and Gynecology

## 2020-12-06 DIAGNOSIS — F419 Anxiety disorder, unspecified: Secondary | ICD-10-CM

## 2020-12-06 DIAGNOSIS — K219 Gastro-esophageal reflux disease without esophagitis: Secondary | ICD-10-CM

## 2020-12-06 HISTORY — DX: Anxiety disorder, unspecified: F41.9

## 2020-12-06 HISTORY — DX: Gastro-esophageal reflux disease without esophagitis: K21.9

## 2020-12-06 NOTE — Progress Notes (Signed)
Spoke w/ via phone for pre-op interview---patient Lab needs dos---- CBC, type & screen, urine pregnancy             Lab results------none COVID test -----patient states asymptomatic no test needed Arrive at -------0745 NPO after MN NO Solid Food.  Clear liquids from MN until---0645 Med rec completed Medications to take morning of surgery -----none Diabetic medication -----n/a Patient instructed no nail polish to be worn day of surgery Patient instructed to bring photo id and insurance card day of surgery Patient aware to have Driver (ride ) / caregiver    for 24 hours after surgery - boyfriend Christiane Ha  Patient Special Instructions ----- n/a Pre-Op special Istructions ----- n/a Patient verbalized understanding of instructions that were given at this phone interview. Patient denies shortness of breath, chest pain, fever, cough at this phone interview.

## 2020-12-10 NOTE — H&P (Signed)
Kristine Hurst is an 25 y.o. female P2 here for scheduled sterilization procedure. Patient is doing well without complaints  Pertinent Gynecological History: Menses: regular every month without intermenstrual spotting Contraception: abstinence DES exposure: denies Blood transfusions: none  Last pap: normal Date: 09/25/20 OB History: G2, P2   Menstrual History: Patient's last menstrual period was 11/21/2020 (approximate).    Past Medical History:  Diagnosis Date   Abnormal CT scan, chest 05/17/2012   CT 01/2012:  1cm calcified density RUL, 67mm calcified nodule LLL, +calcified hilar/mediast nodes.  +spleenic calcifications. + from [place] Neg PPD 12/2011     Anxiety 12/06/2020   per patient   GERD (gastroesophageal reflux disease) 12/06/2020   per patient   Medical history non-contributory    Seasonal allergies     Past Surgical History:  Procedure Laterality Date   INDUCED ABORTION  2013    Family History  Problem Relation Age of Onset   Diabetes Other        Great gradparents paternal   Diabetes Other        Great gradparents maternal   Hypertension Maternal Grandmother    Diabetes Maternal Grandmother    Hypertension Maternal Grandfather    Allergies Maternal Grandfather    Heart attack Maternal Grandfather    Sleep apnea Maternal Grandfather    Allergies Brother    Heart disease Other        great maternal grandparent   Lung cancer Other        great maternal grandparent   Pancreatic cancer Other        great maternal grandparent   Melanoma Other        great maternal grandparent   Asthma Mother    Emphysema Other        great grandfather    Social History:  reports that she quit smoking about 2 years ago. Her smoking use included cigarettes. She has a 0.60 pack-year smoking history. She has never used smokeless tobacco. She reports current alcohol use. She reports current drug use. Drug: Marijuana.  Allergies:  Allergies  Allergen Reactions   Cefprozil  Hives and Other (See Comments)    Reaction to Cefzil - unknown childhood allergic reaction   Poison Ivy Extract Swelling   Poison Oak Extract Swelling    Medications Prior to Admission  Medication Sig Dispense Refill Last Dose   acetaminophen (TYLENOL) 325 MG tablet Take 650 mg by mouth every 6 (six) hours as needed.   Past Week   ibuprofen (ADVIL) 200 MG tablet Take 200 mg by mouth every 6 (six) hours as needed.   More than a month    Review of Systems See pertinent in HPI. All other systems reviewed and non contributory Blood pressure (!) 144/81, pulse 87, temperature 98.2 F (36.8 C), temperature source Oral, resp. rate 17, height 5\' 7"  (1.702 m), weight 132.9 kg, last menstrual period 11/21/2020, SpO2 97 %. Physical Exam GENERAL: Well-developed, well-nourished female in no acute distress.  LUNGS: Clear to auscultation bilaterally.  HEART: Regular rate and rhythm. ABDOMEN: Soft, nontender, nondistended. No organomegaly. PELVIC: Deferred to OR EXTREMITIES: No cyanosis, clubbing, or edema, 2+ distal pulses.  Results for orders placed or performed during the hospital encounter of 12/11/20 (from the past 24 hour(s))  Pregnancy, urine POC     Status: None   Collection Time: 12/11/20  7:52 AM  Result Value Ref Range   Preg Test, Ur NEGATIVE NEGATIVE  Type and screen     Status: None (Preliminary  result)   Collection Time: 12/11/20  8:23 AM  Result Value Ref Range   ABO/RH(D) PENDING    Antibody Screen PENDING    Sample Expiration      12/14/2020,2359 Performed at [facility], [address] [place], [place] [address]   CBC     Status: None   Collection Time: 12/11/20  8:23 AM  Result Value Ref Range   WBC 8.1 4.0 - 10.5 K/uL   RBC 4.49 3.87 - 5.11 MIL/uL   Hemoglobin 13.9 12.0 - 15.0 g/dL   HCT 37.4 82.7 - 07.8 %   MCV 92.4 80.0 - 100.0 fL   MCH 31.0 26.0 - 34.0 pg   MCHC 33.5 30.0 - 36.0 g/dL   RDW 67.5 44.9 - 20.1 %   Platelets 284 150 - 400 K/uL    nRBC 0.0 0.0 - 0.2 %    No results found.  Assessment/Plan:  25 y.o. [id number]  with undesired fertility, desires permanent sterilization. Other reversible forms of contraception were discussed with patient; she declines all other modalities.  Risks of procedure discussed with patient including permanence of method, bleeding, infection, injury to surrounding organs and need for additional procedures including laparotomy, risk of regret.  Failure risk of 0.5-1% with increased risk of ectopic gestation if pregnancy occurs was also discussed with patient.        Kristine Hurst 12/11/2020, 9:43 AM

## 2020-12-11 ENCOUNTER — Encounter (HOSPITAL_BASED_OUTPATIENT_CLINIC_OR_DEPARTMENT_OTHER)
Admission: RE | Disposition: A | Discharge status: Home / Self Care | Payer: Self-pay | Source: Ambulatory Visit | Attending: Obstetrics and Gynecology

## 2020-12-11 ENCOUNTER — Ambulatory Visit (HOSPITAL_BASED_OUTPATIENT_CLINIC_OR_DEPARTMENT_OTHER): Payer: Medicaid Other | Admitting: Certified Registered Nurse Anesthetist

## 2020-12-11 ENCOUNTER — Encounter (HOSPITAL_BASED_OUTPATIENT_CLINIC_OR_DEPARTMENT_OTHER): Payer: Self-pay | Admitting: Obstetrics and Gynecology

## 2020-12-11 ENCOUNTER — Ambulatory Visit (HOSPITAL_BASED_OUTPATIENT_CLINIC_OR_DEPARTMENT_OTHER)
Admission: RE | Admit: 2020-12-11 | Discharge: 2020-12-11 | Disposition: A | Discharge status: Home / Self Care | Payer: Medicaid Other | Source: Ambulatory Visit | Attending: Obstetrics and Gynecology | Admitting: Obstetrics and Gynecology

## 2020-12-11 ENCOUNTER — Other Ambulatory Visit: Payer: Self-pay

## 2020-12-11 ENCOUNTER — Telehealth: Payer: Self-pay | Admitting: Radiology

## 2020-12-11 DIAGNOSIS — Z881 Allergy status to other antibiotic agents status: Secondary | ICD-10-CM | POA: Insufficient documentation

## 2020-12-11 DIAGNOSIS — Z302 Encounter for sterilization: Secondary | ICD-10-CM | POA: Diagnosis present

## 2020-12-11 DIAGNOSIS — Z87891 Personal history of nicotine dependence: Secondary | ICD-10-CM | POA: Diagnosis not present

## 2020-12-11 HISTORY — PX: LAPAROSCOPIC TUBAL LIGATION: SHX1937

## 2020-12-11 LAB — CBC
HCT: 41.5 % (ref 36.0–46.0)
Hemoglobin: 13.9 g/dL (ref 12.0–15.0)
MCH: 31 pg (ref 26.0–34.0)
MCHC: 33.5 g/dL (ref 30.0–36.0)
MCV: 92.4 fL (ref 80.0–100.0)
Platelets: 284 10*3/uL (ref 150–400)
RBC: 4.49 MIL/uL (ref 3.87–5.11)
RDW: 12.7 % (ref 11.5–15.5)
WBC: 8.1 10*3/uL (ref 4.0–10.5)
nRBC: 0 % (ref 0.0–0.2)

## 2020-12-11 LAB — TYPE AND SCREEN
ABO/RH(D): O POS
Antibody Screen: NEGATIVE

## 2020-12-11 LAB — POCT PREGNANCY, URINE: Preg Test, Ur: NEGATIVE

## 2020-12-11 SURGERY — LIGATION, FALLOPIAN TUBE, LAPAROSCOPIC
Anesthesia: General | Laterality: Bilateral

## 2020-12-11 MED ORDER — DEXAMETHASONE SODIUM PHOSPHATE 4 MG/ML IJ SOLN
INTRAMUSCULAR | Status: DC | PRN
  Administered 2020-12-11: 8 mg via INTRAVENOUS

## 2020-12-11 MED ORDER — GLYCOPYRROLATE 0.2 MG/ML IJ SOLN
INTRAMUSCULAR | Status: DC | PRN
  Administered 2020-12-11: .2 mg via INTRAVENOUS

## 2020-12-11 MED ORDER — OXYCODONE-ACETAMINOPHEN 5-325 MG PO TABS: 1.0000 | ORAL_TABLET | ORAL | 0 refills | Status: AC | PRN

## 2020-12-11 MED ORDER — PHENYLEPHRINE 40 MCG/ML (10ML) SYRINGE FOR IV PUSH (FOR BLOOD PRESSURE SUPPORT)
PREFILLED_SYRINGE | INTRAVENOUS | Status: AC
  Filled 2020-12-11: qty 10

## 2020-12-11 MED ORDER — IBUPROFEN 600 MG PO TABS: 600.0000 mg | ORAL_TABLET | Freq: Four times a day (QID) | ORAL | 3 refills | Status: AC | PRN

## 2020-12-11 MED ORDER — FENTANYL CITRATE (PF) 100 MCG/2ML IJ SOLN: 25.0000 ug | INTRAMUSCULAR | Status: DC | PRN

## 2020-12-11 MED ORDER — ROCURONIUM BROMIDE 100 MG/10ML IV SOLN
INTRAVENOUS | Status: DC | PRN
  Administered 2020-12-11: 55 mg via INTRAVENOUS

## 2020-12-11 MED ORDER — MIDAZOLAM HCL 5 MG/5ML IJ SOLN
INTRAMUSCULAR | Status: DC | PRN
  Administered 2020-12-11: 2 mg via INTRAVENOUS

## 2020-12-11 MED ORDER — MIDAZOLAM HCL 2 MG/2ML IJ SOLN
INTRAMUSCULAR | Status: AC
  Filled 2020-12-11: qty 2

## 2020-12-11 MED ORDER — ROCURONIUM BROMIDE 10 MG/ML (PF) SYRINGE
PREFILLED_SYRINGE | INTRAVENOUS | Status: AC
  Filled 2020-12-11: qty 10

## 2020-12-11 MED ORDER — ONDANSETRON HCL 4 MG/2ML IJ SOLN
INTRAMUSCULAR | Status: AC
  Filled 2020-12-11: qty 2

## 2020-12-11 MED ORDER — SODIUM CHLORIDE 0.9 % IR SOLN
Status: DC | PRN
  Administered 2020-12-11: 1000 mL

## 2020-12-11 MED ORDER — LIDOCAINE HCL (CARDIAC) PF 100 MG/5ML IV SOSY
PREFILLED_SYRINGE | INTRAVENOUS | Status: DC | PRN
  Administered 2020-12-11: 50 mg via INTRAVENOUS

## 2020-12-11 MED ORDER — KETOROLAC TROMETHAMINE 30 MG/ML IJ SOLN
INTRAMUSCULAR | Status: DC | PRN
  Administered 2020-12-11: 30 mg via INTRAVENOUS

## 2020-12-11 MED ORDER — DEXAMETHASONE SODIUM PHOSPHATE 10 MG/ML IJ SOLN
INTRAMUSCULAR | Status: AC
  Filled 2020-12-11: qty 1

## 2020-12-11 MED ORDER — LIDOCAINE 2% (20 MG/ML) 5 ML SYRINGE
INTRAMUSCULAR | Status: AC
  Filled 2020-12-11: qty 10

## 2020-12-11 MED ORDER — LIDOCAINE 2% (20 MG/ML) 5 ML SYRINGE
INTRAMUSCULAR | Status: AC
  Filled 2020-12-11: qty 5

## 2020-12-11 MED ORDER — KETOROLAC TROMETHAMINE 30 MG/ML IJ SOLN
INTRAMUSCULAR | Status: AC
  Filled 2020-12-11: qty 1

## 2020-12-11 MED ORDER — SUGAMMADEX SODIUM 500 MG/5ML IV SOLN
INTRAVENOUS | Status: DC | PRN
  Administered 2020-12-11: 250 mg via INTRAVENOUS

## 2020-12-11 MED ORDER — BUPIVACAINE HCL (PF) 0.25 % IJ SOLN
INTRAMUSCULAR | Status: DC | PRN
  Administered 2020-12-11: 20 mL

## 2020-12-11 MED ORDER — FENTANYL CITRATE (PF) 100 MCG/2ML IJ SOLN
INTRAMUSCULAR | Status: AC
  Filled 2020-12-11: qty 2

## 2020-12-11 MED ORDER — ONDANSETRON HCL 4 MG/2ML IJ SOLN
INTRAMUSCULAR | Status: DC | PRN
  Administered 2020-12-11: 4 mg via INTRAVENOUS

## 2020-12-11 MED ORDER — PROPOFOL 10 MG/ML IV BOLUS
INTRAVENOUS | Status: DC | PRN
Start: 2020-12-11 — End: 2020-12-11
  Administered 2020-12-11: 200 mg via INTRAVENOUS

## 2020-12-11 MED ORDER — FENTANYL CITRATE (PF) 100 MCG/2ML IJ SOLN
INTRAMUSCULAR | Status: DC | PRN
  Administered 2020-12-11 (×2): 50 ug via INTRAVENOUS

## 2020-12-11 MED ORDER — PHENYLEPHRINE HCL (PRESSORS) 10 MG/ML IV SOLN
INTRAVENOUS | Status: DC | PRN
  Administered 2020-12-11 (×3): 80 ug via INTRAVENOUS

## 2020-12-11 MED ORDER — LACTATED RINGERS IV SOLN
INTRAVENOUS | Status: DC
  Administered 2020-12-11: 1000 mL via INTRAVENOUS

## 2020-12-11 MED ORDER — PROPOFOL 10 MG/ML IV BOLUS
INTRAVENOUS | Status: AC
  Filled 2020-12-11: qty 20

## 2020-12-11 SURGICAL SUPPLY — 34 items
ADH SKN CLS APL DERMABOND .7 (GAUZE/BANDAGES/DRESSINGS) ×1
APL SRG 38 LTWT LNG FL B (MISCELLANEOUS)
APPLICATOR ARISTA FLEXITIP XL (MISCELLANEOUS) IMPLANT
BAG SPEC RTRVL LRG 6X4 10 (ENDOMECHANICALS)
DERMABOND ADVANCED (GAUZE/BANDAGES/DRESSINGS) ×1
DERMABOND ADVANCED .7 DNX12 (GAUZE/BANDAGES/DRESSINGS) ×1 IMPLANT
DRSG OPSITE POSTOP 3X4 (GAUZE/BANDAGES/DRESSINGS) ×2 IMPLANT
DURAPREP 26ML APPLICATOR (WOUND CARE) ×4 IMPLANT
ELECT REM PT RETURN 9FT ADLT (ELECTROSURGICAL) ×2
ELECTRODE REM PT RTRN 9FT ADLT (ELECTROSURGICAL) ×1 IMPLANT
GAUZE 4X4 16PLY ~~LOC~~+RFID DBL (SPONGE) ×2 IMPLANT
GLOVE SURG POLYISO LF SZ6 (GLOVE) ×2 IMPLANT
GLOVE SURG POLYISO LF SZ6.5 (GLOVE) ×2 IMPLANT
GLOVE SURG UNDER POLY LF SZ6.5 (GLOVE) ×4 IMPLANT
GOWN STRL REUS W/TWL LRG LVL3 (GOWN DISPOSABLE) ×2 IMPLANT
HEMOSTAT ARISTA ABSORB 3G PWDR (HEMOSTASIS) IMPLANT
KIT TURNOVER CYSTO (KITS) ×2 IMPLANT
NS IRRIG 1000ML POUR BTL (IV SOLUTION) IMPLANT
PACK LAPAROSCOPY BASIN (CUSTOM PROCEDURE TRAY) ×2 IMPLANT
PACK TRENDGUARD 450 HYBRID PRO (MISCELLANEOUS) ×1 IMPLANT
POUCH SPECIMEN RETRIEVAL 10MM (ENDOMECHANICALS) IMPLANT
PROTECTOR NERVE ULNAR (MISCELLANEOUS) ×4 IMPLANT
SET SUCTION IRRIG HYDROSURG (IRRIGATION / IRRIGATOR) IMPLANT
SET TRI-LUMEN FLTR TB AIRSEAL (TUBING) IMPLANT
SET TUBE SMOKE EVAC HIGH FLOW (TUBING) ×2 IMPLANT
SHEARS HARMONIC ACE PLUS 36CM (ENDOMECHANICALS) IMPLANT
SUT MNCRL AB 4-0 PS2 18 (SUTURE) ×2 IMPLANT
SUT VICRYL 0 UR6 27IN ABS (SUTURE) IMPLANT
TOWEL OR 17X26 10 PK STRL BLUE (TOWEL DISPOSABLE) ×2 IMPLANT
TRAY FOLEY W/BAG SLVR 14FR LF (SET/KITS/TRAYS/PACK) ×2 IMPLANT
TRENDGUARD 450 HYBRID PRO PACK (MISCELLANEOUS) ×2
TROCAR BALLN 12MMX100 BLUNT (TROCAR) ×2 IMPLANT
TROCAR BLADELESS OPT 5 100 (ENDOMECHANICALS) ×6 IMPLANT
WARMER LAPAROSCOPE (MISCELLANEOUS) ×2 IMPLANT

## 2020-12-11 NOTE — Discharge Instructions (Signed)

## 2020-12-11 NOTE — Anesthesia Postprocedure Evaluation (Signed)
Anesthesia Post Note  Patient: Kristine Hurst  Procedure(s) Performed: LAPAROSCOPIC TUBAL LIGATION with coagulation (Bilateral)     Patient location during evaluation: PACU Anesthesia Type: General Level of consciousness: awake Pain management: pain level controlled Vital Signs Assessment: post-procedure vital signs reviewed and stable Respiratory status: spontaneous breathing Cardiovascular status: stable Postop Assessment: no apparent nausea or vomiting Anesthetic complications: no   No notable events documented.  Last Vitals:  Vitals:   12/11/20 1130 12/11/20 1150  BP: 113/82 108/72  Pulse: 62 (!) 59  Resp: 18 16  Temp: 36.6 C 36.7 C  SpO2: 99% 98%    Last Pain:  Vitals:   12/11/20 1150  TempSrc:   PainSc: 1                  Lurie Mullane

## 2020-12-11 NOTE — Anesthesia Preprocedure Evaluation (Addendum)
Anesthesia Evaluation  Patient identified by MRN, date of birth, ID band Patient awake    Reviewed: Allergy & Precautions, NPO status , Patient's Chart, lab work & pertinent test results  Airway Mallampati: II  TM Distance: >3 FB     Dental   Pulmonary neg pulmonary ROS, Patient abstained from smoking., former smoker,    breath sounds clear to auscultation       Cardiovascular METS:   negative cardio ROS   Rhythm:Regular Rate:Normal     Neuro/Psych    GI/Hepatic Neg liver ROS, GERD  ,  Endo/Other  negative endocrine ROS  Renal/GU negative Renal ROS     Musculoskeletal   Abdominal   Peds  Hematology   Anesthesia Other Findings   Reproductive/Obstetrics                            Anesthesia Physical Anesthesia Plan  ASA: 2  Anesthesia Plan: General   Post-op Pain Management:    Induction: Intravenous  PONV Risk Score and Plan: 3 and Ondansetron, Dexamethasone and Midazolam  Airway Management Planned: Oral ETT  Additional Equipment:   Intra-op Plan:   Post-operative Plan: Extubation in OR  Informed Consent: I have reviewed the patients History and Physical, chart, labs and discussed the procedure including the risks, benefits and alternatives for the proposed anesthesia with the patient or authorized representative who has indicated his/her understanding and acceptance.     Dental advisory given  Plan Discussed with: CRNA and Anesthesiologist  Anesthesia Plan Comments:        Anesthesia Quick Evaluation

## 2020-12-11 NOTE — Anesthesia Procedure Notes (Signed)
Procedure Name: Intubation Date/Time: 12/11/2020 9:55 AM Performed by: Cleda Clarks, CRNA Pre-anesthesia Checklist: Patient identified, Emergency Drugs available, Suction available and Patient being monitored Patient Re-evaluated:Patient Re-evaluated prior to induction Oxygen Delivery Method: Circle system utilized Preoxygenation: Pre-oxygenation with 100% oxygen Induction Type: IV induction Ventilation: Mask ventilation without difficulty Laryngoscope Size: Miller and 2 Tube type: Oral Tube size: 7.0 mm Number of attempts: 1 Airway Equipment and Method: Stylet and Oral airway Placement Confirmation: ETT inserted through vocal cords under direct vision, positive ETCO2 and breath sounds checked- equal and bilateral Secured at: 22 cm Tube secured with: Tape Dental Injury: Teeth and Oropharynx as per pre-operative assessment

## 2020-12-11 NOTE — Op Note (Signed)
Kristine Hurst 12/11/2020  PREOPERATIVE DIAGNOSIS:  Undesired fertility  POSTOPERATIVE DIAGNOSIS:  Undesired fertility  PROCEDURE:  Laparoscopic Bilateral Tubal Sterilization using coagulation   SURGEON: Dr. Catalina Antigua  ANESTHESIA:  General endotracheal  COMPLICATIONS:  None immediate.  ESTIMATED BLOOD LOSS:  Less than 10 ml.  FLUIDS: 1000 ml LR.  URINE OUTPUT:  25 ml of clear urine.  INDICATIONS: 25 y.o. [id number]  with undesired fertility, desires permanent sterilization. Other reversible forms of contraception were discussed with patient; she declines all other modalities.  Risks of procedure discussed with patient including permanence of method, bleeding, infection, injury to surrounding organs and need for additional procedures including laparotomy, risk of regret.  Failure risk of 0.5-1% with increased risk of ectopic gestation if pregnancy occurs was also discussed with patient.      FINDINGS:  Normal uterus, tubes, and ovaries.  TECHNIQUE:  The patient was taken to the operating room where general anesthesia was obtained without difficulty.  She was then placed in the dorsal lithotomy position and prepared and draped in sterile fashion.  After an adequate timeout was performed, a bivalved speculum was then placed in the patient's vagina, and the anterior lip of cervix grasped with the single-tooth tenaculum.  The uterine manipulator was then advanced into the uterus.  The speculum was removed from the vagina.  Attention was then turned to the patient's abdomen where a 10-mm skin incision was made on the umbilical fold.  The underlying fascia was identified, grasped with Kocher clamps, tented up and entered sharply with mayo scissors. The fascia was tagged with 0-Vicryl. The peritoneum was entered bluntly.The 11 mm trocar and sleeve were introduced into the abdominal cavityl.  Intraperitoneal placement was confirmed with the use of the laparoscope. Pneumoperitoneum was achieved by  the insufflation of CO2 gas. A survey of the patient's pelvis and abdomen revealed entirely normal anatomy.  The fallopian tubes were observed and found to be normal in appearance. Bipolar forceps was then advanced through the operative port and used to coagulate a 3-cm portion of the left tube in the mid isthmic area.  Good blanching and coagulation was noted at the site of the application.  There was no bleeding noted in the mesosalpinx.  A similar process was carried out on the right fallopian tube. Good hemostasis was noted overall. The instruments were then removed from the patient's abdomen and the fascial incision was repaired with 0 Vicryl, and the skin was closed with 4-0 Vicryl. The uterine manipulator and the tenaculum were removed from the vagina without complications. The patient tolerated the procedure well.  Sponge, lap, and needle counts were correct times two.  The patient was then taken to the recovery room awake, extubated and in stable  in stable condition.

## 2020-12-11 NOTE — Transfer of Care (Signed)
Immediate Anesthesia Transfer of Care Note  Patient: Kristine Hurst  Procedure(s) Performed: LAPAROSCOPIC TUBAL LIGATION with coagulation (Bilateral)  Patient Location: PACU  Anesthesia Type:General  Level of Consciousness: awake, alert  and oriented  Airway & Oxygen Therapy: Patient Spontanous Breathing and Patient connected to nasal cannula oxygen  Post-op Assessment: Report given to RN and Post -op Vital signs reviewed and stable  Post vital signs: Reviewed and stable  Last Vitals:  Vitals Value Taken Time  BP 114/60 12/11/20 1056  Temp    Pulse 83 12/11/20 1059  Resp 22 12/11/20 1059  SpO2 99 % 12/11/20 1059  Vitals shown include unvalidated device data.  Last Pain:  Vitals:   12/11/20 0809  TempSrc:   PainSc: 0-No pain      Patients Stated Pain Goal: 6 (12/11/20 0809)  Complications: No notable events documented.

## 2020-12-11 NOTE — Telephone Encounter (Signed)
Left message for patient on voicemail with postop appointment time and date with Dr Jolayne Panther

## 2020-12-12 ENCOUNTER — Encounter (HOSPITAL_BASED_OUTPATIENT_CLINIC_OR_DEPARTMENT_OTHER): Payer: Self-pay | Admitting: Obstetrics and Gynecology

## 2021-01-01 ENCOUNTER — Encounter: Payer: Medicaid Other | Admitting: Obstetrics and Gynecology

## 2021-01-08 ENCOUNTER — Other Ambulatory Visit: Payer: Self-pay | Admitting: Neurology

## 2021-01-08 ENCOUNTER — Emergency Department (HOSPITAL_COMMUNITY)
Admission: EM | Admit: 2021-01-08 | Discharge: 2021-01-09 | Disposition: A | Discharge status: Home / Self Care | Payer: Medicaid Other | Attending: Emergency Medicine | Admitting: Emergency Medicine

## 2021-01-08 ENCOUNTER — Emergency Department (HOSPITAL_COMMUNITY): Payer: Medicaid Other

## 2021-01-08 ENCOUNTER — Other Ambulatory Visit: Payer: Self-pay

## 2021-01-08 ENCOUNTER — Telehealth: Payer: Self-pay | Admitting: Neurology

## 2021-01-08 DIAGNOSIS — H4710 Unspecified papilledema: Secondary | ICD-10-CM

## 2021-01-08 DIAGNOSIS — R51 Headache with orthostatic component, not elsewhere classified: Secondary | ICD-10-CM

## 2021-01-08 DIAGNOSIS — G441 Vascular headache, not elsewhere classified: Secondary | ICD-10-CM

## 2021-01-08 DIAGNOSIS — H538 Other visual disturbances: Secondary | ICD-10-CM | POA: Insufficient documentation

## 2021-01-08 DIAGNOSIS — G8929 Other chronic pain: Secondary | ICD-10-CM

## 2021-01-08 DIAGNOSIS — Z5321 Procedure and treatment not carried out due to patient leaving prior to being seen by health care provider: Secondary | ICD-10-CM | POA: Diagnosis not present

## 2021-01-08 DIAGNOSIS — H543 Unqualified visual loss, both eyes: Secondary | ICD-10-CM

## 2021-01-08 LAB — CBC WITH DIFFERENTIAL/PLATELET
Abs Immature Granulocytes: 0.04 K/uL (ref 0.00–0.07)
Basophils Absolute: 0.1 K/uL (ref 0.0–0.1)
Basophils Relative: 0 %
Eosinophils Absolute: 0.2 K/uL (ref 0.0–0.5)
Eosinophils Relative: 1 %
HCT: 40.9 % (ref 36.0–46.0)
Hemoglobin: 13.7 g/dL (ref 12.0–15.0)
Immature Granulocytes: 0 %
Lymphocytes Relative: 22 %
Lymphs Abs: 2.9 K/uL (ref 0.7–4.0)
MCH: 31 pg (ref 26.0–34.0)
MCHC: 33.5 g/dL (ref 30.0–36.0)
MCV: 92.5 fL (ref 80.0–100.0)
Monocytes Absolute: 0.9 K/uL (ref 0.1–1.0)
Monocytes Relative: 7 %
Neutro Abs: 9.4 K/uL — ABNORMAL HIGH (ref 1.7–7.7)
Neutrophils Relative %: 70 %
Platelets: 338 K/uL (ref 150–400)
RBC: 4.42 MIL/uL (ref 3.87–5.11)
RDW: 12.5 % (ref 11.5–15.5)
WBC: 13.5 K/uL — ABNORMAL HIGH (ref 4.0–10.5)
nRBC: 0 % (ref 0.0–0.2)

## 2021-01-08 LAB — BASIC METABOLIC PANEL
Anion gap: 6 (ref 5–15)
BUN: 12 mg/dL (ref 6–20)
CO2: 26 mmol/L (ref 22–32)
Calcium: 9.3 mg/dL (ref 8.9–10.3)
Chloride: 105 mmol/L (ref 98–111)
Creatinine, Ser: 0.74 mg/dL (ref 0.44–1.00)
GFR, Estimated: >60 mL/min (ref >60)
Glucose, Bld: 100 mg/dL — ABNORMAL HIGH (ref 70–99)
Potassium: 4.3 mmol/L (ref 3.5–5.1)
Sodium: 137 mmol/L (ref 135–145)

## 2021-01-08 NOTE — Telephone Encounter (Signed)
Tori/Jillian: Can you call patient urgently and see if she can come see me at 1:45 for a 2pm appointment? I can see her and we can decide if she needs the ED or not. [phone] (pod [phone] fyi)

## 2021-01-08 NOTE — Telephone Encounter (Signed)
Patient is already in the ER. Dr Lucia Gaskins aware.

## 2021-01-08 NOTE — ED Triage Notes (Signed)
Pt dx with papilledema sent here by ophthalmology for brain imaging and LP to further evaluate bilateral vision loss, ongoing x several months. Pt reports blurry vision in L eye and bad vision in R eye. Endorses mild headache.

## 2021-01-08 NOTE — ED Provider Notes (Signed)
Emergency Medicine Provider Triage Evaluation Note  Kristine Hurst , a 25 y.o. female  was evaluated in triage.  Pt complains of blurred vision has been ongoing for some time.  She had an evaluation with her ophthalmologist and was sent to the emergency department for work-up of idiopathic intracranial hypertension.  She did have papilledema on exam prior to arrival at the ophthalmologist office.  She does also endorse chronic headaches localized to the frontal scalp.  No trouble walking.  Review of Systems  Positive:  Negative: See above   Physical Exam  BP 112/81 (BP Location: Right Arm)   Pulse 80   Temp 98.6 F (37 C) (Oral)   Resp 16   SpO2 99%  Gen:   Awake, no distress   Resp:  Normal effort  MSK:   Moves extremities without difficulty  Other:  EOMI  Medical Decision Making  Medically screening exam initiated at 1:31 PM.  Appropriate orders placed.  Kristine Hurst was informed that the remainder of the evaluation will be completed by another provider, this initial triage assessment does not replace that evaluation, and the importance of remaining in the ED until their evaluation is complete.     Kristine Lower, PA-C 01/08/21 1336    Kristine Savoy, MD 01/08/21 [phone]

## 2021-01-09 ENCOUNTER — Emergency Department (HOSPITAL_COMMUNITY): Payer: Medicaid Other

## 2021-01-09 ENCOUNTER — Telehealth: Payer: Self-pay | Admitting: Neurology

## 2021-01-09 NOTE — Telephone Encounter (Signed)
Working on availability the afternoon of wed 01/16/21, will update when I know for sure.

## 2021-01-09 NOTE — Telephone Encounter (Signed)
Kristine Hurst/Kristine Hurst: This is an urgent consult from Dr. Dione Booze. She had a head CT. Can we get her in the office within the next 2 weeks? Also call and tell her we are getting her a spinal tap, she did not want to wait for that in the ED (the wait was 19 hours)  Also, I placed an urgent LP request for her. Can someone call [facility] and see how quickly we can get that completed? Ladona Ridgel or Irving Burton do you mind calling over to GI and seeing when they can schedule? Her CT was negative for mass.

## 2021-01-09 NOTE — Telephone Encounter (Signed)
I sent the LP order to [facility] at [facility]. Patient will be contacted within the day.

## 2021-01-09 NOTE — ED Notes (Signed)
Pt did not want to wait and left. 

## 2021-01-10 NOTE — Telephone Encounter (Signed)
Her LP is Tuesday 11/8 @ 11:30 AM.

## 2021-01-10 NOTE — Telephone Encounter (Addendum)
Tried reaching out to offer patient an appointment on Wednesday November 9th at 11:00 with Dr Lucia Gaskins. No answer but was able to leave a voicemail. Also sent mychart message offering appointment date and time.

## 2021-01-15 ENCOUNTER — Other Ambulatory Visit: Payer: Self-pay

## 2021-01-15 ENCOUNTER — Telehealth: Payer: Self-pay | Admitting: Neurology

## 2021-01-15 ENCOUNTER — Ambulatory Visit
Admission: RE | Admit: 2021-01-15 | Discharge: 2021-01-15 | Disposition: A | Discharge status: Home / Self Care | Payer: Medicaid Other | Source: Ambulatory Visit | Attending: Neurology | Admitting: Neurology

## 2021-01-15 DIAGNOSIS — H4710 Unspecified papilledema: Secondary | ICD-10-CM

## 2021-01-15 DIAGNOSIS — R519 Headache, unspecified: Secondary | ICD-10-CM

## 2021-01-15 DIAGNOSIS — G8929 Other chronic pain: Secondary | ICD-10-CM

## 2021-01-15 LAB — CSF CELL COUNT WITH DIFFERENTIAL
RBC Count, CSF: 69 cells/uL — ABNORMAL HIGH
WBC, CSF: 0 cells/uL (ref 0–5)

## 2021-01-15 LAB — PROTEIN, CSF: Total Protein, CSF: 26 mg/dL (ref 15–45)

## 2021-01-15 LAB — GLUCOSE, CSF: Glucose, CSF: 46 mg/dL (ref 40–80)

## 2021-01-15 NOTE — Discharge Instructions (Signed)

## 2021-01-15 NOTE — Telephone Encounter (Signed)
Ariana/Maggie: Her LP was completed, opening pressure 29, has papilledema, neg CT head, she has IDIOPATHIC INTRACRANIAL HYPERTENSION. She needs an appointment, she declined appointment we offered in the past but she has to be seen in our office to be treated. She can see me or Dr. Delena Bali. I spoke with Dr. Dione Booze, he can prescribe diamox or other in the meantime but we can;t treat her if she isn;t seen in our office thanks

## 2021-01-18 ENCOUNTER — Ambulatory Visit
Admission: RE | Admit: 2021-01-18 | Discharge: 2021-01-18 | Disposition: A | Discharge status: Home / Self Care | Payer: Medicaid Other | Source: Ambulatory Visit | Attending: Neurology | Admitting: Neurology

## 2021-01-18 ENCOUNTER — Telehealth: Payer: Self-pay | Admitting: Neurology

## 2021-01-18 ENCOUNTER — Other Ambulatory Visit: Payer: Self-pay | Admitting: Neurology

## 2021-01-18 ENCOUNTER — Telehealth: Payer: Self-pay

## 2021-01-18 DIAGNOSIS — G971 Other reaction to spinal and lumbar puncture: Secondary | ICD-10-CM

## 2021-01-18 DIAGNOSIS — H4710 Unspecified papilledema: Secondary | ICD-10-CM

## 2021-01-18 MED ORDER — IOPAMIDOL (ISOVUE-M 200) INJECTION 41%: 1.0000 mL | Freq: Once | INTRAMUSCULAR | Status: DC

## 2021-01-18 NOTE — Telephone Encounter (Signed)
[facility] called again stating that they are needing the order for the blood patch as soon as possible due to having less than an hour. Helmut Muster states that the pt is vomiting.

## 2021-01-18 NOTE — Telephone Encounter (Signed)
I cannot give [facility] a verbal order for blood patch, the EPIC orders for blood patch list no [place] imaging facility as local.  The patient has not been seen in office and is de facto not a [facility] patient - what am I to do?

## 2021-01-18 NOTE — Discharge Instructions (Signed)

## 2021-01-18 NOTE — Progress Notes (Signed)
Injection of Isovue-M 200 shows a good epidural pattern with spread above and below the level of needle placement, primarily on the right. No vascular or subarachnoid opacification is seen.  THERAPEUTIC EPIDURAL INJECTION  18 mL of the patient's blood was injected into the epidural space at the site of prior lumbar puncture.  IMPRESSION: Technically successful lumbar epidural blood patch at L3-4.   Electronically Signed   By: Sebastian Ache M.D.   On: 01/18/2021 15:28

## 2021-01-18 NOTE — Telephone Encounter (Signed)
Pt called states she is still having a headache after he LP. Pt states the only way to relieve the pain is to lay down. Pt requesting a call back.

## 2021-01-18 NOTE — Telephone Encounter (Signed)
[facility] @ [facility] has called for a STAT need of a blood patch, Lumbar puncture was ordered by Dr Lucia Gaskins.  Helmut Muster states this is needed before 12 noon.  Helmut Muster can be reached at [phone]

## 2021-01-18 NOTE — Progress Notes (Signed)
20cc of blood drawn from pts R AC for blood patch. 1 successful attempt, pt tolerated well. Gauze and tape applied after.  

## 2021-01-18 NOTE — Telephone Encounter (Signed)
On call MD w/[facility] Porfirio Mylar Dohmeier contacted [facility] to place verbal order to proceed w/Blood patch on 01/18/21 d/t severity of symptoms status post LP on 01/15/21. On call MD does NOT have access to computer at this time but will co-sign order when computer access available. Contacted scheduler SB to place order.    Spoke w/patient at length this morning and patient is having continued nausea, vomiting, headaches and dizziness post procedure even after laying flat for the recommended 48 hours. Relief when laying down but unable to stand in upright position.

## 2021-01-21 NOTE — Telephone Encounter (Signed)
Called pt back @ [phone] and LVM letting pt know we got her message from Friday. Asking for call back. Left office number in message.

## 2021-01-22 NOTE — Telephone Encounter (Signed)
I called pt and relayed that no other recommendations per Dr. Lucia Gaskins.  To discuss with Dr. Delena Bali tomorrow at her 1530 appt.  She verbalized understanding.  No change from when I spoke to her this am.  She appreciated call back.

## 2021-01-22 NOTE — Telephone Encounter (Signed)
I called patient.  She had her spinal tap done last Tuesday.  On Friday she had a tremendous headache pain and was crying had some nausea with some dry heaving.  She had a blood patch done on Friday morning and was able to sit up ,no headache same day.  She stated since then she has had tingling in her legs and her feet more in her right leg , from knee down to her feet after the blood patch.I t is constant, when she is up walking it eases up.  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  I told her it could just take time for that to resolve.  But I would check and get any recommendations from Dr. Lucia Gaskins  and let her know.

## 2021-01-23 ENCOUNTER — Ambulatory Visit: Payer: Managed Care, Other (non HMO) | Admitting: Psychiatry

## 2021-01-23 NOTE — Progress Notes (Deleted)
[facility]  PATIENT: Kristine Hurst DOB: [birth year]  REFERRING CLINICIAN: No ref. provider found HISTORY FROM: *** REASON FOR VISIT: papilledema   HISTORICAL  CHIEF COMPLAINT:  No chief complaint on file.   HISTORY OF PRESENT ILLNESS:  25 year old patient who presents for evaluation of IIH. She was seen by ophthalmology*** who noted grade 4 papilledema***. Was sent to the ED 11/1 where [facility] was unremarkable. LP was performed on 11/8 which showed an opening pressure of 29, closing pressure was 15.  She developed a severe headache three days after her LP*** and underwent a blood patch on 11/11.  OTHER MEDICAL CONDITIONS: ***   REVIEW OF SYSTEMS: Full 14 system review of systems performed and negative with exception of: ***  ALLERGIES: Allergies  Allergen Reactions   Cefprozil Hives and Other (See Comments)    Reaction to Cefzil - unknown childhood allergic reaction   Poison Ivy Extract Swelling   Poison Oak Extract Swelling    HOME MEDICATIONS: Outpatient Medications Prior to Visit  Medication Sig Dispense Refill   acetaminophen (TYLENOL) 325 MG tablet Take 650 mg by mouth every 6 (six) hours as needed.     ibuprofen (ADVIL) 600 MG tablet Take 1 tablet (600 mg total) by mouth every 6 (six) hours as needed. 60 tablet 3   oxyCODONE-acetaminophen (PERCOCET/ROXICET) 5-325 MG tablet Take 1 tablet by mouth every 4 (four) hours as needed. 20 tablet 0   No facility-administered medications prior to visit.    PAST MEDICAL HISTORY: Past Medical History:  Diagnosis Date   Abnormal CT scan, chest 05/17/2012   CT 01/2012:  1cm calcified density RUL, 27mm calcified nodule LLL, +calcified hilar/mediast nodes.  +spleenic calcifications. + from [place] Neg PPD 12/2011     Anxiety 12/06/2020   per patient   GERD (gastroesophageal reflux disease) 12/06/2020   per patient   Medical history non-contributory    Seasonal allergies     PAST SURGICAL  HISTORY: Past Surgical History:  Procedure Laterality Date   INDUCED ABORTION  2013   LAPAROSCOPIC TUBAL LIGATION Bilateral 12/11/2020   Procedure: LAPAROSCOPIC TUBAL LIGATION with coagulation;  Surgeon: Catalina Antigua, MD;  Location: [facility];  Service: Gynecology;  Laterality: Bilateral;    FAMILY HISTORY: Family History  Problem Relation Age of Onset   Diabetes Other        Great gradparents paternal   Diabetes Other        Great gradparents maternal   Hypertension Maternal Grandmother    Diabetes Maternal Grandmother    Hypertension Maternal Grandfather    Allergies Maternal Grandfather    Heart attack Maternal Grandfather    Sleep apnea Maternal Grandfather    Allergies Brother    Heart disease Other        great maternal grandparent   Lung cancer Other        great maternal grandparent   Pancreatic cancer Other        great maternal grandparent   Melanoma Other        great maternal grandparent   Asthma Mother    Emphysema Other        great grandfather    SOCIAL HISTORY: Social History   Socioeconomic History   Marital status: Single    Spouse name: Not on file   Number of children: Not on file   Years of education: Not on file   Highest education level: Not on file  Occupational History   Occupation: Conservation officer, nature  Comment: [employer]  Tobacco Use   Smoking status: Former    Packs/day: 0.20    Years: 3.00    Pack years: 0.60    Types: Cigarettes    Quit date: 2020    Years since quitting: 2.8   Smokeless tobacco: Never   Tobacco comments:    1 cig per day  Vaping Use   Vaping Use: Every day  Substance and Sexual Activity   Alcohol use: Yes    Comment: rarely   Drug use: Yes    Types: Marijuana    Comment: only used in teenage years   Sexual activity: Yes    Birth control/protection: None  Other Topics Concern   Not on file  Social History Narrative   Not on file   Social Determinants of Health   Financial Resource  Strain: Not on file  Food Insecurity: Not on file  Transportation Needs: Not on file  Physical Activity: Not on file  Stress: Not on file  Social Connections: Not on file  Intimate Partner Violence: Not on file     PHYSICAL EXAM ***  GENERAL EXAM/CONSTITUTIONAL: Vitals: There were no vitals filed for this visit. There is no height or weight on file to calculate BMI. Wt Readings from Last 3 Encounters:  12/11/20 293 lb 1.6 oz (132.9 kg)  09/25/20 289 lb (131.1 kg)  03/30/20 279 lb (126.6 kg)   Patient is in no distress; well developed, nourished and groomed; neck is supple  CARDIOVASCULAR: Examination of carotid arteries is normal; no carotid bruits Regular rate and rhythm, no murmurs Examination of peripheral vascular system by observation and palpation is normal  EYES: Pupils round and reactive to light, Visual fields full to confrontation, Extraocular movements intacts,   MUSCULOSKELETAL: Gait, strength, tone, movements noted in Neurologic exam below  NEUROLOGIC: MENTAL STATUS:  No flowsheet data found. awake, alert, oriented to person, place and time recent and remote memory intact normal attention and concentration language fluent, comprehension intact, naming intact fund of knowledge appropriate  CRANIAL NERVE:  2nd - no papilledema or hemorrhages on fundoscopic exam 2nd, 3rd, 4th, 6th - pupils equal and reactive to light, visual fields full to confrontation, extraocular muscles intact, no nystagmus 5th - facial sensation symmetric 7th - facial strength symmetric 8th - hearing intact 9th - palate elevates symmetrically, uvula midline 11th - shoulder shrug symmetric 12th - tongue protrusion midline  MOTOR:  normal bulk and tone, full strength in the BUE, BLE  SENSORY:  normal and symmetric to light touch, pinprick, temperature, vibration  COORDINATION:  finger-nose-finger, fine finger movements normal  REFLEXES:  deep tendon reflexes present and  symmetric  GAIT/STATION:  normal     DIAGNOSTIC DATA (LABS, IMAGING, TESTING) - I reviewed patient records, labs, notes, testing and imaging myself where available.  Lab Results  Component Value Date   WBC 13.5 (H) 01/08/2021   HGB 13.7 01/08/2021   HCT 40.9 01/08/2021   MCV 92.5 01/08/2021   PLT 338 01/08/2021      Component Value Date/Time   NA 137 01/08/2021 1337   NA 139 01/12/2018 1545   K 4.3 01/08/2021 1337   CL 105 01/08/2021 1337   CO2 26 01/08/2021 1337   GLUCOSE 100 (H) 01/08/2021 1337   BUN 12 01/08/2021 1337   BUN 7 01/12/2018 1545   CREATININE 0.74 01/08/2021 1337   CREATININE 0.57 11/21/2015 0925   CALCIUM 9.3 01/08/2021 1337   PROT 6.8 02/08/2019 1440   PROT 6.0 01/12/2018  1545   ALBUMIN 3.8 02/08/2019 1440   ALBUMIN 4.0 01/12/2018 1545   AST 16 02/08/2019 1440   ALT 17 02/08/2019 1440   ALKPHOS 59 02/08/2019 1440   BILITOT 0.4 02/08/2019 1440   BILITOT <0.2 01/12/2018 1545   GFRNONAA >60 01/08/2021 1337   GFRAA >60 02/08/2019 1440   No results found for: CHOL, HDL, LDLCALC, LDLDIRECT, TRIG, CHOLHDL Lab Results  Component Value Date   HGBA1C 5.1 01/12/2018   No results found for: [id number] Lab Results  Component Value Date   TSH 2.020 01/12/2018    ***    ASSESSMENT AND PLAN  25 y.o. year old female with ***   No diagnosis found.    PLAN:   No orders of the defined types were placed in this encounter.   No orders of the defined types were placed in this encounter.   No follow-ups on file.    Ocie Doyne, MD  I spent an average of *** chart reviewing and counseling the patient, with at least 50% of the time face to face with the patient.   [facility] [address] [place], [place] [address] [phone]

## 2021-03-06 ENCOUNTER — Ambulatory Visit
Admission: EM | Admit: 2021-03-06 | Discharge: 2021-03-06 | Disposition: A | Discharge status: Home / Self Care | Payer: Medicaid Other | Attending: Urgent Care | Admitting: Urgent Care

## 2021-03-06 ENCOUNTER — Other Ambulatory Visit: Payer: Self-pay

## 2021-03-06 DIAGNOSIS — R0981 Nasal congestion: Secondary | ICD-10-CM | POA: Diagnosis not present

## 2021-03-06 DIAGNOSIS — J3089 Other allergic rhinitis: Secondary | ICD-10-CM | POA: Diagnosis not present

## 2021-03-06 DIAGNOSIS — J069 Acute upper respiratory infection, unspecified: Secondary | ICD-10-CM

## 2021-03-06 DIAGNOSIS — R070 Pain in throat: Secondary | ICD-10-CM | POA: Diagnosis not present

## 2021-03-06 DIAGNOSIS — Z720 Tobacco use: Secondary | ICD-10-CM

## 2021-03-06 MED ORDER — PREDNISONE 20 MG PO TABS: ORAL_TABLET | ORAL | 0 refills | Status: AC

## 2021-03-06 MED ORDER — CETIRIZINE HCL 10 MG PO TABS: 10.0000 mg | ORAL_TABLET | Freq: Every day | ORAL | 0 refills | Status: AC

## 2021-03-06 NOTE — ED Provider Notes (Signed)
[facility]   MRN: [id number] DOB: 1995-03-30  Subjective:   Kristine Hurst is a 25 y.o. female presenting for 1+ week history of persistent sinus congestion, sinus pressure, postnasal drainage and throat pain.  Has also had a cough but it is not as bothersome.  No fever, body aches, chest pain, shortness of breath, wheezing.  She does have a history of allergic rhinitis but is not taking anything for them.  She is also vaping multiple times daily.  No smoking of cigarettes or marijuana.  Does not want a COVID and flu test.  She did start to have some diarrhea earlier today as well.  No current facility-administered medications for this encounter.  Current Outpatient Medications:    acetaminophen (TYLENOL) 325 MG tablet, Take 650 mg by mouth every 6 (six) hours as needed., Disp: , Rfl:    ibuprofen (ADVIL) 600 MG tablet, Take 1 tablet (600 mg total) by mouth every 6 (six) hours as needed., Disp: 60 tablet, Rfl: 3   oxyCODONE-acetaminophen (PERCOCET/ROXICET) 5-325 MG tablet, Take 1 tablet by mouth every 4 (four) hours as needed., Disp: 20 tablet, Rfl: 0   Allergies  Allergen Reactions   Cefprozil Hives and Other (See Comments)    Reaction to Cefzil - unknown childhood allergic reaction   Poison Ivy Extract Swelling   Poison Oak Extract Swelling    Past Medical History:  Diagnosis Date   Abnormal CT scan, chest 05/17/2012   CT 01/2012:  1cm calcified density RUL, 34mm calcified nodule LLL, +calcified hilar/mediast nodes.  +spleenic calcifications. + from [place] Neg PPD 12/2011     Anxiety 12/06/2020   per patient   GERD (gastroesophageal reflux disease) 12/06/2020   per patient   Medical history non-contributory    Seasonal allergies      Past Surgical History:  Procedure Laterality Date   INDUCED ABORTION  2013   LAPAROSCOPIC TUBAL LIGATION Bilateral 12/11/2020   Procedure: LAPAROSCOPIC TUBAL LIGATION with coagulation;  Surgeon: Catalina Antigua, MD;  Location:  [facility];  Service: Gynecology;  Laterality: Bilateral;    Family History  Problem Relation Age of Onset   Diabetes Other        Great gradparents paternal   Diabetes Other        Great gradparents maternal   Hypertension Maternal Grandmother    Diabetes Maternal Grandmother    Hypertension Maternal Grandfather    Allergies Maternal Grandfather    Heart attack Maternal Grandfather    Sleep apnea Maternal Grandfather    Allergies Brother    Heart disease Other        great maternal grandparent   Lung cancer Other        great maternal grandparent   Pancreatic cancer Other        great maternal grandparent   Melanoma Other        great maternal grandparent   Asthma Mother    Emphysema Other        great grandfather    Social History   Tobacco Use   Smoking status: Former    Packs/day: 0.20    Years: 3.00    Pack years: 0.60    Types: Cigarettes    Quit date: 2020    Years since quitting: 2.9   Smokeless tobacco: Never   Tobacco comments:    1 cig per day  Vaping Use   Vaping Use: Every day  Substance Use Topics   Alcohol use: Yes    Comment:  rarely   Drug use: Yes    Types: Marijuana    Comment: only used in teenage years    ROS   Objective:   Vitals: BP 110/78    Pulse 94    Temp 97.9 F (36.6 C)    Resp 18    Wt 284 lb 1.6 oz (128.9 kg)    LMP 02/27/2021    SpO2 98%    BMI 44.50 kg/m   Physical Exam Constitutional:      General: She is not in acute distress.    Appearance: Normal appearance. She is well-developed. She is obese. She is not ill-appearing, toxic-appearing or diaphoretic.  HENT:     Head: Normocephalic and atraumatic.     Right Ear: Tympanic membrane, ear canal and external ear normal. No drainage or tenderness. No middle ear effusion. Tympanic membrane is not erythematous.     Left Ear: Tympanic membrane, ear canal and external ear normal. No drainage or tenderness.  No middle ear effusion. Tympanic membrane is not  erythematous.     Nose: Nose normal. No congestion or rhinorrhea.     Mouth/Throat:     Mouth: Mucous membranes are moist. No oral lesions.     Pharynx: No pharyngeal swelling, oropharyngeal exudate, posterior oropharyngeal erythema or uvula swelling.     Tonsils: No tonsillar exudate or tonsillar abscesses.  Eyes:     General: No scleral icterus.       Right eye: No discharge.        Left eye: No discharge.     Extraocular Movements: Extraocular movements intact.     Right eye: Normal extraocular motion.     Left eye: Normal extraocular motion.     Conjunctiva/sclera: Conjunctivae normal.     Pupils: Pupils are equal, round, and reactive to light.  Cardiovascular:     Rate and Rhythm: Normal rate and regular rhythm.     Pulses: Normal pulses.     Heart sounds: Normal heart sounds. No murmur heard.   No friction rub. No gallop.  Pulmonary:     Effort: Pulmonary effort is normal. No respiratory distress.     Breath sounds: Normal breath sounds. No stridor. No wheezing, rhonchi or rales.  Musculoskeletal:     Cervical back: Normal range of motion and neck supple.  Lymphadenopathy:     Cervical: No cervical adenopathy.  Skin:    General: Skin is warm and dry.     Findings: No rash.  Neurological:     General: No focal deficit present.     Mental Status: She is alert and oriented to person, place, and time.  Psychiatric:        Mood and Affect: Mood normal.        Behavior: Behavior normal.        Thought Content: Thought content normal.        Judgment: Judgment normal.    Assessment and Plan :   PDMP not reviewed this encounter.  1. Viral URI with cough   2. Allergic rhinitis due to other allergic trigger, unspecified seasonality   3. Sinus congestion   4. Throat pain   5. Vapes nicotine containing substance     Patient declined testing. Deferred imaging given clear cardiopulmonary exam, hemodynamically stable vital signs.  Will manage for viral URI with supportive  care.  In the context of her allergic rhinitis and daily vaping recommended an oral prednisone course. Counseled patient on potential for adverse effects with medications prescribed/recommended today,  ER and return-to-clinic precautions discussed, patient verbalized understanding.    Wallis Bamberg, PA-C 03/06/21 1120

## 2021-03-06 NOTE — ED Triage Notes (Signed)
Pt presents with c/o nasal congestion and sore throat for over a week

## 2021-06-12 ENCOUNTER — Other Ambulatory Visit: Payer: Self-pay

## 2021-06-12 ENCOUNTER — Encounter: Payer: Self-pay | Admitting: Emergency Medicine

## 2021-06-12 ENCOUNTER — Ambulatory Visit
Admission: EM | Admit: 2021-06-12 | Discharge: 2021-06-12 | Disposition: A | Discharge status: Home / Self Care | Payer: Medicaid Other | Attending: Urgent Care | Admitting: Urgent Care

## 2021-06-12 ENCOUNTER — Ambulatory Visit (HOSPITAL_COMMUNITY)
Admission: EM | Admit: 2021-06-12 | Discharge: 2021-06-12 | Disposition: A | Discharge status: Home / Self Care | Payer: Medicaid Other | Attending: Nurse Practitioner | Admitting: Nurse Practitioner

## 2021-06-12 DIAGNOSIS — Z202 Contact with and (suspected) exposure to infections with a predominantly sexual mode of transmission: Secondary | ICD-10-CM

## 2021-06-12 MED ORDER — GENTAMICIN SULFATE 40 MG/ML IJ SOLN
240.0000 mg | Freq: Once | INTRAMUSCULAR | Status: AC
  Administered 2021-06-12: 240 mg via INTRAMUSCULAR

## 2021-06-12 MED ORDER — AZITHROMYCIN 500 MG PO TABS: 2000.0000 mg | ORAL_TABLET | Freq: Once | ORAL | 0 refills | Status: AC

## 2021-06-12 MED ORDER — GENTAMICIN SULFATE 40 MG/ML IJ SOLN
INTRAMUSCULAR | Status: AC
  Filled 2021-06-12: qty 6

## 2021-06-12 NOTE — Discharge Instructions (Addendum)
Please send over to the [facility] urgent care off of the [facility] by the cold hospital.  I have notified our staff there that you will be needing treatment for your gonorrhea exposure.  You will receive an injection of gentamicin at a dose of 240 mg.  I have prescribed azithromycin at a dose of 2 g which means you will take 4 tablets just once.  This is an appropriate treatment to avoid any allergic reactions with an injection of ceftriaxone.  Our nurse will notify you of any test results that require more treatment sometime tomorrow. ?

## 2021-06-12 NOTE — ED Triage Notes (Signed)
Pt here to get tx for gonorrhea. ?

## 2021-06-12 NOTE — ED Provider Notes (Signed)
?[facility] ? ? ?MRN: [id number] DOB: 1995-09-26 ? ?Subjective:  ? ?Kristine Hurst is a 26 y.o. female presenting for STI treatment. Had exposure to gonorrhea and would like to get treated.  No concern for pregnancy.  Denies fever, n/v, abdominal pain, pelvic pain, rashes, dysuria, urinary frequency, hematuria, vaginal discharge.  ? ?No current facility-administered medications for this encounter. ? ?Current Outpatient Medications:  ?  acetaminophen (TYLENOL) 325 MG tablet, Take 650 mg by mouth every 6 (six) hours as needed., Disp: , Rfl:  ?  cetirizine (ZYRTEC ALLERGY) 10 MG tablet, Take 1 tablet (10 mg total) by mouth daily., Disp: 30 tablet, Rfl: 0 ?  ibuprofen (ADVIL) 600 MG tablet, Take 1 tablet (600 mg total) by mouth every 6 (six) hours as needed., Disp: 60 tablet, Rfl: 3 ?  oxyCODONE-acetaminophen (PERCOCET/ROXICET) 5-325 MG tablet, Take 1 tablet by mouth every 4 (four) hours as needed., Disp: 20 tablet, Rfl: 0 ?  predniSONE (DELTASONE) 20 MG tablet, Take 2 tablets daily with breakfast., Disp: 10 tablet, Rfl: 0  ? ?Allergies  ?Allergen Reactions  ? Cefprozil Hives and Other (See Comments)  ?  Reaction to Cefzil - unknown childhood allergic reaction  ? Poison Ivy Extract Swelling  ? Poison Oak Extract Swelling  ? ? ?Past Medical History:  ?Diagnosis Date  ? Abnormal CT scan, chest 05/17/2012  ? CT 01/2012:  1cm calcified density RUL, 39mm calcified nodule LLL, +calcified hilar/mediast nodes.  +spleenic calcifications. + from [place] Neg PPD 12/2011    ? Anxiety 12/06/2020  ? per patient  ? GERD (gastroesophageal reflux disease) 12/06/2020  ? per patient  ? Medical history non-contributory   ? Seasonal allergies   ?  ? ?Past Surgical History:  ?Procedure Laterality Date  ? INDUCED ABORTION  2013  ? LAPAROSCOPIC TUBAL LIGATION Bilateral 12/11/2020  ? Procedure: LAPAROSCOPIC TUBAL LIGATION with coagulation;  Surgeon: Catalina Antigua, MD;  Location: [facility];  Service:  Gynecology;  Laterality: Bilateral;  ? ? ?Family History  ?Problem Relation Age of Onset  ? Diabetes Other   ?     Great gradparents paternal  ? Diabetes Other   ?     Great gradparents maternal  ? Hypertension Maternal Grandmother   ? Diabetes Maternal Grandmother   ? Hypertension Maternal Grandfather   ? Allergies Maternal Grandfather   ? Heart attack Maternal Grandfather   ? Sleep apnea Maternal Grandfather   ? Allergies Brother   ? Heart disease Other   ?     great maternal grandparent  ? Lung cancer Other   ?     great maternal grandparent  ? Pancreatic cancer Other   ?     great maternal grandparent  ? Melanoma Other   ?     great maternal grandparent  ? Asthma Mother   ? Emphysema Other   ?     great grandfather  ? ? ?Social History  ? ?Tobacco Use  ? Smoking status: Former  ?  Packs/day: 0.20  ?  Years: 3.00  ?  Pack years: 0.60  ?  Types: Cigarettes  ?  Quit date: 2020  ?  Years since quitting: 3.2  ? Smokeless tobacco: Never  ? Tobacco comments:  ?  1 cig per day  ?Vaping Use  ? Vaping Use: Every day  ?Substance Use Topics  ? Alcohol use: Yes  ?  Comment: rarely  ? Drug use: Yes  ?  Types: Marijuana  ?  Comment: only  used in teenage years  ? ? ?ROS ? ? ?Objective:  ? ?Vitals: ?BP 124/82 (BP Location: Right Arm)   Pulse 86   Temp (!) 97.3 ?F (36.3 ?C) (Oral)   Resp 18   Ht 5\' 7"  (1.702 m)   Wt 280 lb (127 kg)   LMP 05/22/2021 (Approximate)   SpO2 95%   BMI 43.85 kg/m?  ? ?Physical Exam ?Constitutional:   ?   General: She is not in acute distress. ?   Appearance: Normal appearance. She is well-developed. She is not ill-appearing, toxic-appearing or diaphoretic.  ?HENT:  ?   Head: Normocephalic and atraumatic.  ?   Right Ear: External ear normal.  ?   Left Ear: External ear normal.  ?   Nose: Nose normal.  ?   Mouth/Throat:  ?   Mouth: Mucous membranes are moist.  ?Eyes:  ?   General: No scleral icterus.    ?   Right eye: No discharge.     ?   Left eye: No discharge.  ?   Extraocular Movements:  Extraocular movements intact.  ?Cardiovascular:  ?   Rate and Rhythm: Normal rate.  ?Pulmonary:  ?   Effort: Pulmonary effort is normal.  ?Skin: ?   General: Skin is warm and dry.  ?Neurological:  ?   General: No focal deficit present.  ?   Mental Status: She is alert and oriented to person, place, and time.  ?Psychiatric:     ?   Mood and Affect: Mood normal.     ?   Behavior: Behavior normal.  ? ? ?Assessment and Plan :  ? ?PDMP not reviewed this encounter. ? ?1. Exposure to gonorrhea   ?2. Exposure to STD   ? ?Empiric treatment for gonorrhea with gentamicin and azithromycin given her cephalosporin allergy.  Unfortunately we do not have gentamicin outside and therefore I will coordinate with our side at [facility] to have her treated through a nurse visit.  Labs pending.  Patient will not need treatment for BV or yeast infections as she is asymptomatic. Counseled patient on potential for adverse effects with medications prescribed/recommended today, ER and return-to-clinic precautions discussed, patient verbalized understanding. ? ?  ?[facility], PA-C ?06/12/21 08/12/21 ? ?

## 2021-06-12 NOTE — ED Triage Notes (Signed)
Pt was exposed to gonorrhea and pt would like to be tested. Pt denies any symptoms at this time and reports last unprotected intercourse was x1 month ago. ?

## 2021-06-13 LAB — CERVICOVAGINAL ANCILLARY ONLY
Bacterial Vaginitis (gardnerella): POSITIVE — AB
Candida Glabrata: NEGATIVE
Candida Vaginitis: NEGATIVE
Chlamydia: NEGATIVE
Comment: NEGATIVE
Comment: NEGATIVE
Comment: NEGATIVE
Comment: NEGATIVE
Comment: NEGATIVE
Comment: NORMAL
Neisseria Gonorrhea: NEGATIVE
Trichomonas: NEGATIVE

## 2021-06-14 ENCOUNTER — Telehealth (HOSPITAL_COMMUNITY): Payer: Self-pay | Admitting: Emergency Medicine

## 2021-06-14 MED ORDER — METRONIDAZOLE 500 MG PO TABS: 500.0000 mg | ORAL_TABLET | Freq: Two times a day (BID) | ORAL | 0 refills | Status: AC

## 2022-06-09 ENCOUNTER — Ambulatory Visit
Admission: EM | Admit: 2022-06-09 | Discharge: 2022-06-09 | Disposition: A | Discharge status: Home / Self Care | Payer: Medicaid Other | Attending: Family Medicine | Admitting: Family Medicine

## 2022-06-09 ENCOUNTER — Ambulatory Visit (INDEPENDENT_AMBULATORY_CARE_PROVIDER_SITE_OTHER): Payer: Medicaid Other

## 2022-06-09 DIAGNOSIS — M25571 Pain in right ankle and joints of right foot: Secondary | ICD-10-CM | POA: Diagnosis not present

## 2022-06-09 NOTE — ED Provider Notes (Signed)
[facility]    CSN: [id number] Arrival date & time: 06/09/22  1012      History   Chief Complaint Chief Complaint  Patient presents with   Ankle Pain    HPI Kristine Hurst is a 27 y.o. female.   Pt states she feel off porch at home yesterday and now having pain in right ankle when trying trying to put pressure on it. Having a burning tingling pain that goes up her ankle shin with swelling on ankle.   Patient presenting today with 1 day history of right lateral ankle pain, swelling and significant pain with weightbearing since stepping off a porch accidentally yesterday and twisting the ankle.  She states she has some tingling and numbness to the foot but is able to wiggle her toes.  Denies skin injury, loss of range of motion,   Past Medical History:  Diagnosis Date   Abnormal CT scan, chest 05/17/2012   CT 01/2012:  1cm calcified density RUL, 44mm calcified nodule LLL, +calcified hilar/mediast nodes.  +spleenic calcifications. + from [place] PPD 12/2011     Anxiety 12/06/2020   per patient   GERD (gastroesophageal reflux disease) 12/06/2020   per patient   Medical history non-contributory    Seasonal allergies     Patient Active Problem List   Diagnosis Date Noted   Encounter for sterilization    SVD (spontaneous vaginal delivery) 06/26/2018   Labor and delivery indication for care or intervention 06/25/2018   Supervision of normal pregnancy 11/08/2015   Maternal morbid obesity, antepartum 11/08/2015    Past Surgical History:  Procedure Laterality Date   INDUCED ABORTION  2013   LAPAROSCOPIC TUBAL LIGATION Bilateral 12/11/2020   Procedure: LAPAROSCOPIC TUBAL LIGATION with coagulation;  Surgeon: Mora Bellman, MD;  Location: [facility];  Service: Gynecology;  Laterality: Bilateral;    OB History     Gravida  4   Para  2   Term  2   Preterm  0   AB  2   Living  2      SAB  1   IAB  1   Ectopic  0   Multiple  0    Live Births  2            Home Medications    Prior to Admission medications   Medication Sig Start Date End Date Taking? Authorizing Provider  acetaminophen (TYLENOL) 325 MG tablet Take 650 mg by mouth every 6 (six) hours as needed.   Yes [provider]  cetirizine (ZYRTEC ALLERGY) 10 MG tablet Take 1 tablet (10 mg total) by mouth daily. 03/06/21  Yes Jaynee Eagles, PA-C  ibuprofen (ADVIL) 600 MG tablet Take 1 tablet (600 mg total) by mouth every 6 (six) hours as needed. 12/11/20  Yes Constant, Peggy, MD  metroNIDAZOLE (FLAGYL) 500 MG tablet Take 1 tablet (500 mg total) by mouth 2 (two) times daily. 06/14/21   Lamptey, Myrene Galas, MD  oxyCODONE-acetaminophen (PERCOCET/ROXICET) 5-325 MG tablet Take 1 tablet by mouth every 4 (four) hours as needed. 12/11/20   Constant, Peggy, MD  predniSONE (DELTASONE) 20 MG tablet Take 2 tablets daily with breakfast. 03/06/21   Jaynee Eagles, PA-C    Family History Family History  Problem Relation Age of Onset   Diabetes Other        Great gradparents paternal   Diabetes Other        Great gradparents maternal   Hypertension Maternal Grandmother  Diabetes Maternal Grandmother    Hypertension Maternal Grandfather    Allergies Maternal Grandfather    Heart attack Maternal Grandfather    Sleep apnea Maternal Grandfather    Allergies Brother    Heart disease Other        great maternal grandparent   Lung cancer Other        great maternal grandparent   Pancreatic cancer Other        great maternal grandparent   Melanoma Other        great maternal grandparent   Asthma Mother    Emphysema Other        great grandfather    Social History Social History   Tobacco Use   Smoking status: Former    Packs/day: 0.20    Years: 3.00    Additional pack years: 0.00    Total pack years: 0.60    Types: Cigarettes    Quit date: 2020    Years since quitting: 4.2   Smokeless tobacco: Never   Tobacco comments:    1 cig per day  Vaping Use    Vaping Use: Every day  Substance Use Topics   Alcohol use: Yes    Comment: rarely   Drug use: Yes    Types: Marijuana    Comment: only used in teenage years     Allergies   Cefprozil, Poison ivy extract, and Poison oak extract   Review of Systems Review of Systems PER HPI  Physical Exam Triage Vital Signs ED Triage Vitals [06/09/22 1050]  Enc Vitals Group     BP 112/78     Pulse Rate 89     Resp 18     Temp 98 F (36.7 C)     Temp Source Oral     SpO2 95 %     Weight      Height      Head Circumference      Peak Flow      Pain Score 4     Pain Loc      Pain Edu?      Excl. in [place]?    No data found.  Updated Vital Signs BP 112/78 (BP Location: Right Arm)   Pulse 89   Temp 98 F (36.7 C) (Oral)   Resp 18   LMP 05/23/2022 (Approximate)   SpO2 95%   Visual Acuity Right Eye Distance:   Left Eye Distance:   Bilateral Distance:    Right Eye Near:   Left Eye Near:    Bilateral Near:     Physical Exam Vitals and nursing note reviewed.  Constitutional:      Appearance: Normal appearance. She is not ill-appearing.  HENT:     Head: Atraumatic.  Eyes:     Extraocular Movements: Extraocular movements intact.     Conjunctiva/sclera: Conjunctivae normal.  Cardiovascular:     Rate and Rhythm: Normal rate and regular rhythm.     Heart sounds: Normal heart sounds.  Pulmonary:     Effort: Pulmonary effort is normal.     Breath sounds: Normal breath sounds.  Musculoskeletal:        General: Swelling present.     Cervical back: Normal range of motion and neck supple.     Comments: Decreased range of motion due to pain right ankle.  Tender to palpation around lateral malleolus with mild edema.  No bony deformities palpable.  Ambulating with crutches due to pain with weightbearing  Skin:  General: Skin is warm and dry.  Neurological:     Mental Status: She is alert and oriented to person, place, and time.     Motor: No weakness.     Gait: Gait normal.      Comments: Right lower extremity neurovascularly intact  Psychiatric:        Mood and Affect: Mood normal.        Thought Content: Thought content normal.        Judgment: Judgment normal.      UC Treatments / Results  Labs (all labs ordered are listed, but only abnormal results are displayed) Labs Reviewed - No data to display  EKG   Radiology DG Ankle Complete Right  Result Date: 06/09/2022 CLINICAL DATA:  Ankle injury, pain EXAM: RIGHT ANKLE - COMPLETE 3+ VIEW COMPARISON:  None Available. FINDINGS: There is no evidence of fracture, dislocation, or joint effusion. There is no evidence of arthropathy or other focal bone abnormality. Soft tissues are unremarkable. IMPRESSION: No acute osseous finding Electronically Signed   By: Jerilynn Mages.  Shick M.D.   On: 06/09/2022 11:16    Procedures Procedures (including critical care time)  Medications Ordered in UC Medications - No data to display  Initial Impression / Assessment and Plan / UC Course  I have reviewed the triage vital signs and the nursing notes.  Pertinent labs & imaging results that were available during my care of the patient were reviewed by me and considered in my medical decision making (see chart for details).     X-ray of the right ankle negative for acute bony abnormality.  Ace wrap applied, discussed RICE protocol, over-the-counter pain relievers.  Work note given.  Return for worsening symptoms.  Final Clinical Impressions(s) / UC Diagnoses   Final diagnoses:  Acute right ankle pain     Discharge Instructions      Rest, ice, compression, elevation and ibuprofen and Tylenol as needed for pain relief.    ED Prescriptions   None    PDMP not reviewed this encounter.   Volney American, [place] 06/09/22 1155

## 2022-06-09 NOTE — ED Triage Notes (Signed)
Pt states she feel off porch at home yesterday and now having pain in right ankle when trying trying to put pressure on it. Having a burning tingling pain that goes up her ankle shin with swelling on ankle.

## 2022-06-09 NOTE — Discharge Instructions (Signed)
Rest, ice, compression, elevation and ibuprofen and Tylenol as needed for pain relief.

## 2022-09-30 IMAGING — DX DG ABDOMEN 1V
2 series · 2 of 2 positions shown · non-contrast
Comparison: 02/08/2019.

CLINICAL DATA: pain

EXAM:
ABDOMEN - 1 VIEW

[abdomen supine ap (1 of 2)]
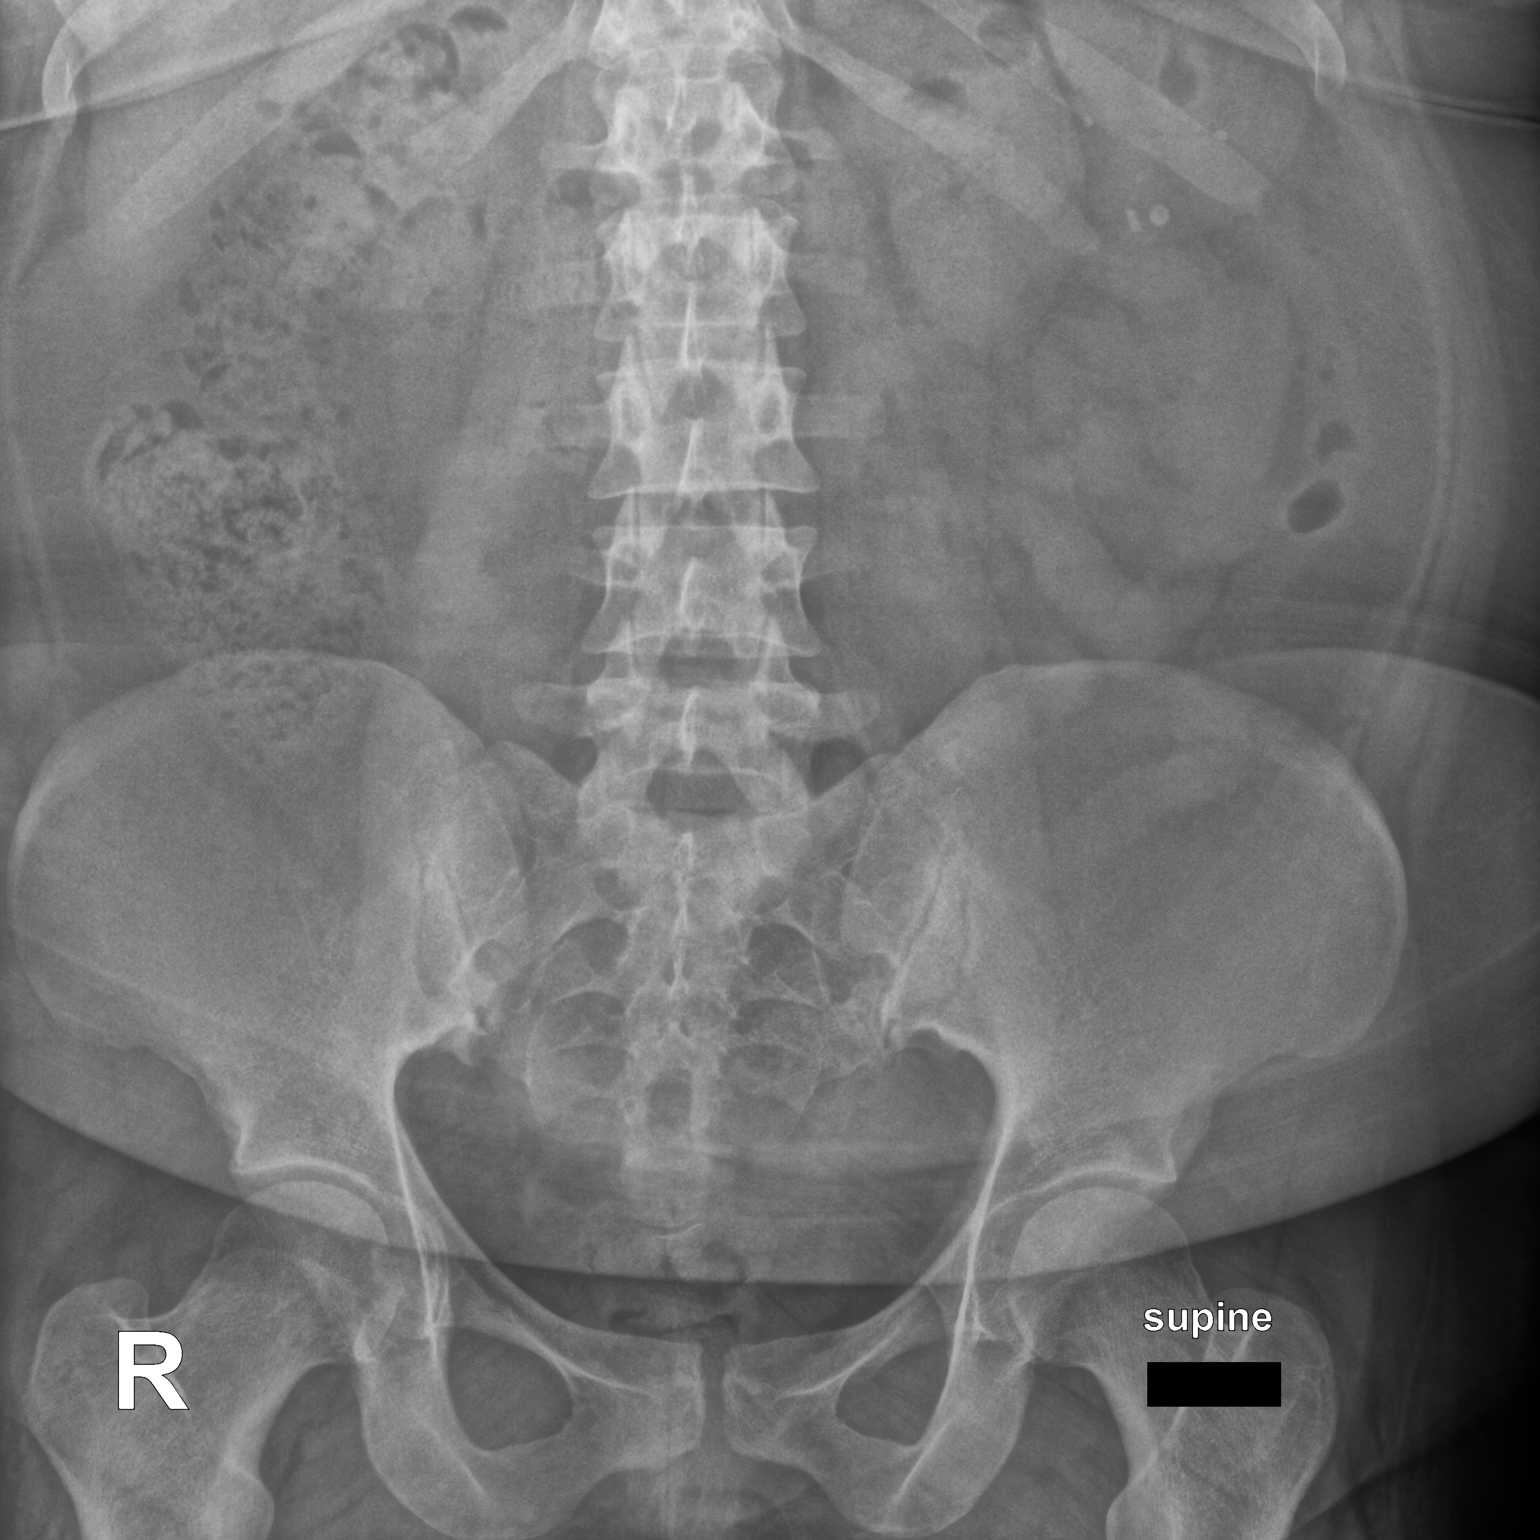

[abdomen supine ap (2 of 2)]
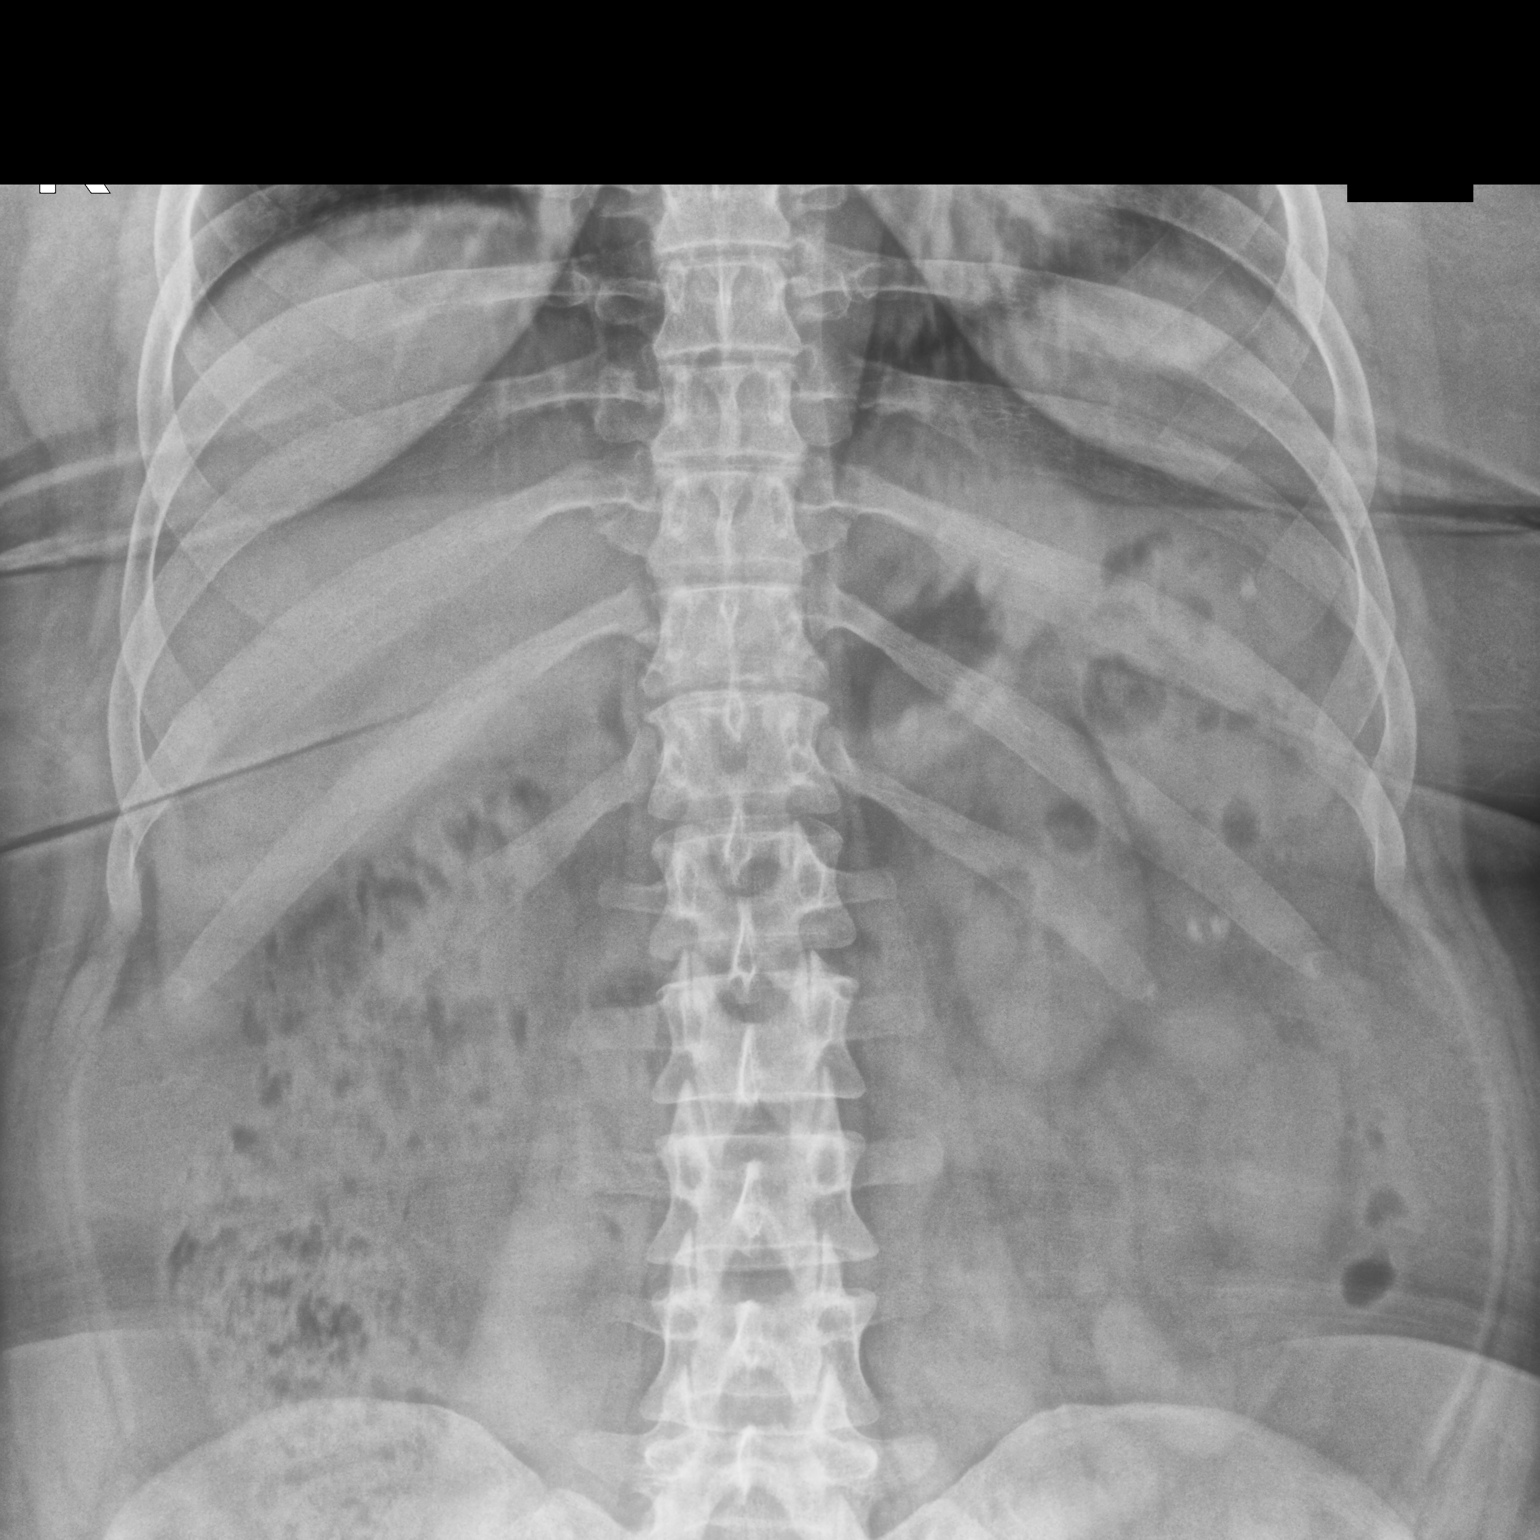

[2 of 2 positions shown; findings below may reference images not displayed]

FINDINGS: Mild stool burden involving the proximal large bowel. Nonobstructive
bowel gas pattern. Left upper quadrant hyperdensities may reflect
ingested material. No suspicious calcific densities overlying the
renal shadows or expected course of the ureters. No acute osseous
abnormality.
IMPRESSION: No acute finding.

Nonobstructive bowel gas pattern.  Mild stool burden.

## 2022-10-10 ENCOUNTER — Ambulatory Visit
Admission: EM | Admit: 2022-10-10 | Discharge: 2022-10-10 | Disposition: A | Discharge status: Home / Self Care | Payer: Medicaid Other | Attending: Nurse Practitioner | Admitting: Nurse Practitioner

## 2022-10-10 DIAGNOSIS — J029 Acute pharyngitis, unspecified: Secondary | ICD-10-CM | POA: Insufficient documentation

## 2022-10-10 DIAGNOSIS — Z1152 Encounter for screening for COVID-19: Secondary | ICD-10-CM | POA: Insufficient documentation

## 2022-10-10 DIAGNOSIS — B349 Viral infection, unspecified: Secondary | ICD-10-CM | POA: Insufficient documentation

## 2022-10-10 LAB — POCT RAPID STREP A (OFFICE): Rapid Strep A Screen: NEGATIVE

## 2022-10-10 MED ORDER — LIDOCAINE VISCOUS HCL 2 % MT SOLN: OROMUCOSAL | 0 refills | Status: AC

## 2022-10-10 NOTE — ED Triage Notes (Signed)
Pt c/o sore throat, congestion, fever x 3 days, started with a pain in pt's throat on Tuesday evening. Woke up Wednesday with congestion and fever.

## 2022-10-10 NOTE — ED Provider Notes (Signed)
[facility]    CSN: [id number] Arrival date & time: 10/10/22  1235      History   Chief Complaint No chief complaint on file.   HPI Kristine Hurst is a 27 y.o. female.   The history is provided by the patient.   The patient presents with a 3-day history of low-grade temperature, nasal congestion and sore throat.  Patient states she had a low-grade temperature during the second day of her symptoms, but none since.  She states throat pain is worse at night.  She denies headache, ear pain, ear drainage, cough, chest pain, abdominal pain, nausea, vomiting, or diarrhea.  Patient reports she does have a history of seasonal allergies.  Reports that she takes allergy medicine when needed.  She states that her boyfriend is also sick, but his symptoms are different.  She states that she did attempt to take a home COVID test, but did not have all of the necessary materials.  Past Medical History:  Diagnosis Date   Abnormal CT scan, chest 05/17/2012   CT 01/2012:  1cm calcified density RUL, 4mm calcified nodule LLL, +calcified hilar/mediast nodes.  +spleenic calcifications. + from [place] [facility] 12/2011     Anxiety 12/06/2020   per patient   GERD (gastroesophageal reflux disease) 12/06/2020   per patient   Medical history non-contributory    Seasonal allergies     Patient Active Problem List   Diagnosis Date Noted   Encounter for sterilization    SVD (spontaneous vaginal delivery) 06/26/2018   Labor and delivery indication for care or intervention 06/25/2018   Supervision of normal pregnancy 11/08/2015   Maternal morbid obesity, antepartum (HCC) 11/08/2015    Past Surgical History:  Procedure Laterality Date   INDUCED ABORTION  2013   LAPAROSCOPIC TUBAL LIGATION Bilateral 12/11/2020   Procedure: LAPAROSCOPIC TUBAL LIGATION with coagulation;  Surgeon: Catalina Antigua, MD;  Location: [facility];  Service: Gynecology;  Laterality: Bilateral;    OB History      Gravida  4   Para  2   Term  2   Preterm  0   AB  2   Living  2      SAB  1   IAB  1   Ectopic  0   Multiple  0   Live Births  2            Home Medications    Prior to Admission medications   Medication Sig Start Date End Date Taking? Authorizing Provider  lidocaine (XYLOCAINE) 2 % solution Gargle and spit 5 mL every 6 hours as needed for throat pain or discomfort. 10/10/22  Yes Isatou Agredano-Warren, Sadie Haber, NP  acetaminophen (TYLENOL) 325 MG tablet Take 650 mg by mouth every 6 (six) hours as needed.    [provider]  cetirizine (ZYRTEC ALLERGY) 10 MG tablet Take 1 tablet (10 mg total) by mouth daily. 03/06/21   Wallis Bamberg, PA-C  ibuprofen (ADVIL) 600 MG tablet Take 1 tablet (600 mg total) by mouth every 6 (six) hours as needed. 12/11/20   Constant, Peggy, MD  metroNIDAZOLE (FLAGYL) 500 MG tablet Take 1 tablet (500 mg total) by mouth 2 (two) times daily. 06/14/21   Lamptey, Britta Mccreedy, MD  oxyCODONE-acetaminophen (PERCOCET/ROXICET) 5-325 MG tablet Take 1 tablet by mouth every 4 (four) hours as needed. 12/11/20   Constant, Peggy, MD  predniSONE (DELTASONE) 20 MG tablet Take 2 tablets daily with breakfast. 03/06/21   Wallis Bamberg, PA-C  Family History Family History  Problem Relation Age of Onset   Diabetes Other        Great gradparents paternal   Diabetes Other        Great gradparents maternal   Hypertension Maternal Grandmother    Diabetes Maternal Grandmother    Hypertension Maternal Grandfather    Allergies Maternal Grandfather    Heart attack Maternal Grandfather    Sleep apnea Maternal Grandfather    Allergies Brother    Heart disease Other        great maternal grandparent   Lung cancer Other        great maternal grandparent   Pancreatic cancer Other        great maternal grandparent   Melanoma Other        great maternal grandparent   Asthma Mother    Emphysema Other        great grandfather    Social History Social History    Tobacco Use   Smoking status: Former    Current packs/day: 0.00    Average packs/day: 0.2 packs/day for 3.0 years (0.6 ttl pk-yrs)    Types: Cigarettes    Start date: 2017    Quit date: 2020    Years since quitting: 4.5   Smokeless tobacco: Never   Tobacco comments:    1 cig per day  Vaping Use   Vaping status: Every Day  Substance Use Topics   Alcohol use: Yes    Comment: rarely   Drug use: Yes    Types: Marijuana    Comment: only used in teenage years     Allergies   Cefprozil, Poison ivy extract, and Poison oak extract   Review of Systems Review of Systems Per HPI  Physical Exam Triage Vital Signs ED Triage Vitals  Encounter Vitals Group     BP 10/10/22 1316 125/84     Systolic BP Percentile --      Diastolic BP Percentile --      Pulse Rate 10/10/22 1316 93     Resp 10/10/22 1316 12     Temp 10/10/22 1316 98.3 F (36.8 C)     Temp Source 10/10/22 1316 Oral     SpO2 10/10/22 1316 96 %     Weight --      Height --      Head Circumference --      Peak Flow --      Pain Score 10/10/22 1322 6     Pain Loc --      Pain Education --      Exclude from Growth Chart --    No data found.  Updated Vital Signs BP 125/84 (BP Location: Right Arm)   Pulse 93   Temp 98.3 F (36.8 C) (Oral)   Resp 12   LMP 09/17/2022 (Exact Date)   SpO2 96%   Visual Acuity Right Eye Distance:   Left Eye Distance:   Bilateral Distance:    Right Eye Near:   Left Eye Near:    Bilateral Near:     Physical Exam Vitals and nursing note reviewed.  Constitutional:      General: She is not in acute distress.    Appearance: Normal appearance.  HENT:     Head: Normocephalic.     Right Ear: Tympanic membrane, ear canal and external ear normal.     Left Ear: Tympanic membrane, ear canal and external ear normal.     Nose: Congestion present.  Mouth/Throat:     Lips: Pink.     Mouth: Mucous membranes are moist.     Pharynx: Uvula midline. Pharyngeal swelling, posterior  oropharyngeal erythema and postnasal drip present. No oropharyngeal exudate or uvula swelling.     Tonsils: 1+ on the right. 1+ on the left.  Eyes:     Extraocular Movements: Extraocular movements intact.     Conjunctiva/sclera: Conjunctivae normal.     Pupils: Pupils are equal, round, and reactive to light.  Cardiovascular:     Rate and Rhythm: Normal rate and regular rhythm.     Pulses: Normal pulses.     Heart sounds: Normal heart sounds.  Pulmonary:     Effort: Pulmonary effort is normal. No respiratory distress.     Breath sounds: Normal breath sounds. No stridor. No wheezing, rhonchi or rales.  Abdominal:     General: Bowel sounds are normal.     Palpations: Abdomen is soft.     Tenderness: There is no abdominal tenderness.  Musculoskeletal:     Cervical back: Normal range of motion.  Lymphadenopathy:     Cervical: No cervical adenopathy.  Skin:    General: Skin is warm and dry.  Neurological:     General: No focal deficit present.     Mental Status: She is alert and oriented to person, place, and time.  Psychiatric:        Mood and Affect: Mood normal.        Behavior: Behavior normal.      UC Treatments / Results  Labs (all labs ordered are listed, but only abnormal results are displayed) Labs Reviewed  CULTURE, GROUP A STREP ([facility])  SARS CORONAVIRUS 2 (TAT 6-24 HRS)  POCT RAPID STREP A (OFFICE)    EKG   Radiology No results found.  Procedures Procedures (including critical care time)  Medications Ordered in UC Medications - No data to display  Initial Impression / Assessment and Plan / UC Course  I have reviewed the triage vital signs and the nursing notes.  Pertinent labs & imaging results that were available during my care of the patient were reviewed by me and considered in my medical decision making (see chart for details).  The patient is well-appearing, she is in no acute distress, vital signs are stable.  Rapid strep test was negative,  throat culture and COVID test are pending.  Suspect symptoms are of viral etiology.  Will provide symptomatic treatment for patient's sore throat with viscous lidocaine 2%.  Supportive care recommendations were provided and discussed with the patient to include over-the-counter analgesics for pain or discomfort, warm salt water gargles, and a soft diet until symptoms improve.  Patient also advised to continue her current allergy regimen daily at this time.  Patient was given indications of when follow-up may be necessary.  Patient is in agreement with this plan of care and verbalizes understanding.  All questions were answered.  Patient stable for discharge.  Work note was provided.  Final Clinical Impressions(s) / UC Diagnoses   Final diagnoses:  Sore throat  Viral illness  Encounter for screening for COVID-19     Discharge Instructions      The rapid strep test was negative.  A throat culture and COVID test are pending.  You will be contacted if the pending test results are abnormal. Take medication as prescribed. May take over-the-counter Tylenol or ibuprofen as needed for pain, fever, or general discomfort. Increase fluids and allow for plenty of rest. Warm salt  water gargles 3-4 times daily as needed for throat pain or discomfort. Recommend a soft diet until symptoms improve, this includes soup, broth, yogurt, pudding, or Jell-O. Recommend warm tea with honey to help with throat pain or discomfort.  May also eat ice chips or cool liquids as needed, whichever is most soothing. Symptoms should improve over the next 7 to 10 days; however, if not, please follow-up in this clinic or with your primary care physician for further evaluation. Follow-up as needed.     ED Prescriptions     Medication Sig Dispense Auth. Provider   lidocaine (XYLOCAINE) 2 % solution Gargle and spit 5 mL every 6 hours as needed for throat pain or discomfort. 75 mL Shamir Sedlar-Warren, Sadie Haber, NP      PDMP not  reviewed this encounter.   Abran Cantor, NP 10/10/22 1347

## 2022-10-10 NOTE — Discharge Instructions (Signed)
The rapid strep test was negative.  A throat culture and COVID test are pending.  You will be contacted if the pending test results are abnormal. Take medication as prescribed. May take over-the-counter Tylenol or ibuprofen as needed for pain, fever, or general discomfort. Increase fluids and allow for plenty of rest. Warm salt water gargles 3-4 times daily as needed for throat pain or discomfort. Recommend a soft diet until symptoms improve, this includes soup, broth, yogurt, pudding, or Jell-O. Recommend warm tea with honey to help with throat pain or discomfort.  May also eat ice chips or cool liquids as needed, whichever is most soothing. Symptoms should improve over the next 7 to 10 days; however, if not, please follow-up in this clinic or with your primary care physician for further evaluation. Follow-up as needed.

## 2022-10-11 ENCOUNTER — Telehealth: Payer: Self-pay | Admitting: Nurse Practitioner

## 2022-10-11 MED ORDER — PAXLOVID (300/100) 20 X 150 MG & 10 X 100MG PO TBPK: 3.0000 | ORAL_TABLET | Freq: Two times a day (BID) | ORAL | 0 refills | Status: AC

## 2022-10-11 MED ORDER — AZITHROMYCIN 250 MG PO TABS: 250.0000 mg | ORAL_TABLET | Freq: Every day | ORAL | 0 refills | Status: AC

## 2022-10-11 NOTE — Telephone Encounter (Signed)
Received patient's results for her COVID test and throat culture.  Called patient to provide prescriptions for the positive throat culture and to determine if she would like to start antiviral therapy.  Spoke with patient, verifying 2 patient identifiers.  Patient would like to be started on antiviral therapy.  Patient was prescribed Paxlovid and azithromycin 250 mg for strep.  Patient was informed that she can return to this office to get an extended work note if needed.  Patient states that she will come to the office to pick up a work note as she does not have MyChart.  Patient verbalized understanding.  All questions were answered.

## 2022-10-12 ENCOUNTER — Telehealth: Payer: Self-pay
